# Patient Record
Sex: Female | Born: 1950 | Race: Black or African American | Hispanic: No | Marital: Single | Smoking: Never smoker
Health system: Southern US, Community
[De-identification: ages and names within clinical notes are randomized; demographics above are authoritative.]

## PROBLEM LIST (undated history)

## (undated) DIAGNOSIS — I10 Essential (primary) hypertension: Secondary | ICD-10-CM

## (undated) DIAGNOSIS — D689 Coagulation defect, unspecified: Secondary | ICD-10-CM

---

## 2018-07-05 ENCOUNTER — Other Ambulatory Visit: Payer: Self-pay

## 2018-07-05 ENCOUNTER — Inpatient Hospital Stay (HOSPITAL_BASED_OUTPATIENT_CLINIC_OR_DEPARTMENT_OTHER)
Admission: EM | Admit: 2018-07-05 | Discharge: 2018-07-08 | DRG: 176 | Disposition: A | Discharge status: Home / Self Care | Payer: Medicare HMO | Attending: Internal Medicine | Admitting: Internal Medicine

## 2018-07-05 ENCOUNTER — Encounter (HOSPITAL_BASED_OUTPATIENT_CLINIC_OR_DEPARTMENT_OTHER): Payer: Self-pay | Admitting: *Deleted

## 2018-07-05 ENCOUNTER — Emergency Department (HOSPITAL_BASED_OUTPATIENT_CLINIC_OR_DEPARTMENT_OTHER): Payer: Medicare HMO

## 2018-07-05 DIAGNOSIS — Z79899 Other long term (current) drug therapy: Secondary | ICD-10-CM | POA: Diagnosis not present

## 2018-07-05 DIAGNOSIS — R7989 Other specified abnormal findings of blood chemistry: Secondary | ICD-10-CM

## 2018-07-05 DIAGNOSIS — R0602 Shortness of breath: Secondary | ICD-10-CM | POA: Diagnosis not present

## 2018-07-05 DIAGNOSIS — E860 Dehydration: Secondary | ICD-10-CM | POA: Diagnosis present

## 2018-07-05 DIAGNOSIS — Z833 Family history of diabetes mellitus: Secondary | ICD-10-CM

## 2018-07-05 DIAGNOSIS — N179 Acute kidney failure, unspecified: Secondary | ICD-10-CM | POA: Diagnosis present

## 2018-07-05 DIAGNOSIS — Z6838 Body mass index (BMI) 38.0-38.9, adult: Secondary | ICD-10-CM | POA: Diagnosis not present

## 2018-07-05 DIAGNOSIS — E669 Obesity, unspecified: Secondary | ICD-10-CM | POA: Diagnosis present

## 2018-07-05 DIAGNOSIS — I248 Other forms of acute ischemic heart disease: Secondary | ICD-10-CM | POA: Diagnosis present

## 2018-07-05 DIAGNOSIS — Z8249 Family history of ischemic heart disease and other diseases of the circulatory system: Secondary | ICD-10-CM

## 2018-07-05 DIAGNOSIS — E785 Hyperlipidemia, unspecified: Secondary | ICD-10-CM | POA: Diagnosis present

## 2018-07-05 DIAGNOSIS — I2699 Other pulmonary embolism without acute cor pulmonale: Secondary | ICD-10-CM | POA: Diagnosis present

## 2018-07-05 DIAGNOSIS — I10 Essential (primary) hypertension: Secondary | ICD-10-CM | POA: Diagnosis present

## 2018-07-05 DIAGNOSIS — R778 Other specified abnormalities of plasma proteins: Secondary | ICD-10-CM | POA: Diagnosis present

## 2018-07-05 HISTORY — DX: Essential (primary) hypertension: I10

## 2018-07-05 LAB — TROPONIN I
Troponin I: 0.18 ng/mL (ref <0.03)
Troponin I: 0.26 ng/mL (ref <0.03)

## 2018-07-05 LAB — CBC
HCT: 42.5 % (ref 36.0–46.0)
Hemoglobin: 12.9 g/dL (ref 12.0–15.0)
MCH: 25.9 pg — ABNORMAL LOW (ref 26.0–34.0)
MCHC: 30.4 g/dL (ref 30.0–36.0)
MCV: 85.3 fL (ref 80.0–100.0)
PLATELETS: 235 10*3/uL (ref 150–400)
RBC: 4.98 MIL/uL (ref 3.87–5.11)
RDW: 14.3 % (ref 11.5–15.5)
WBC: 6.5 10*3/uL (ref 4.0–10.5)
nRBC: 0 % (ref 0.0–0.2)

## 2018-07-05 LAB — BASIC METABOLIC PANEL
Anion gap: 7 (ref 5–15)
BUN: 32 mg/dL — ABNORMAL HIGH (ref 8–23)
CO2: 25 mmol/L (ref 22–32)
Calcium: 9 mg/dL (ref 8.9–10.3)
Chloride: 104 mmol/L (ref 98–111)
Creatinine, Ser: 1.72 mg/dL — ABNORMAL HIGH (ref 0.44–1.00)
GFR calc Af Amer: 35 mL/min — ABNORMAL LOW (ref >60)
GFR, EST NON AFRICAN AMERICAN: 30 mL/min — AB (ref >60)
Glucose, Bld: 165 mg/dL — ABNORMAL HIGH (ref 70–99)
Potassium: 3.8 mmol/L (ref 3.5–5.1)
Sodium: 136 mmol/L (ref 135–145)

## 2018-07-05 MED ORDER — HEPARIN BOLUS VIA INFUSION
4500.0000 [IU] | Freq: Once | INTRAVENOUS | Status: AC
  Administered 2018-07-05: 4500 [IU] via INTRAVENOUS

## 2018-07-05 MED ORDER — HEPARIN (PORCINE) 25000 UT/250ML-% IV SOLN
1300.0000 [IU]/h | INTRAVENOUS | Status: DC
  Administered 2018-07-05: 1200 [IU]/h via INTRAVENOUS
  Administered 2018-07-06: 1050 [IU]/h via INTRAVENOUS
  Filled 2018-07-05 (×4): qty 250

## 2018-07-05 MED ORDER — SODIUM CHLORIDE 0.9 % IV BOLUS
1000.0000 mL | Freq: Once | INTRAVENOUS | Status: AC
  Administered 2018-07-05: 1000 mL via INTRAVENOUS

## 2018-07-05 MED ORDER — IOPAMIDOL (ISOVUE-370) INJECTION 76%
100.0000 mL | Freq: Once | INTRAVENOUS | Status: AC | PRN
  Administered 2018-07-05: 65 mL via INTRAVENOUS

## 2018-07-05 MED ORDER — SODIUM CHLORIDE 0.9 % IV SOLN
INTRAVENOUS | Status: DC | PRN
  Administered 2018-07-05: 500 mL via INTRAVENOUS

## 2018-07-05 MED ORDER — HYDROCODONE-ACETAMINOPHEN 5-325 MG PO TABS
2.0000 | ORAL_TABLET | Freq: Once | ORAL | Status: AC
  Administered 2018-07-05: 2 via ORAL
  Filled 2018-07-05: qty 2

## 2018-07-05 MED ORDER — ASPIRIN 81 MG PO CHEW
324.0000 mg | CHEWABLE_TABLET | Freq: Once | ORAL | Status: AC
  Administered 2018-07-05: 324 mg via ORAL
  Filled 2018-07-05: qty 4

## 2018-07-05 NOTE — Progress Notes (Signed)
  Referring physician: Dr. Clarene Duke at [facility].  88 F with HTN, came to ED with  heart palpitation, fatigue with mild tachycardia ( HR in low 100), afebrile. No chest pain and EKG with rate of 117 and mild ST depression on avF ,V6. Troponin elevated at 0.18. Creatinine of 1.72,( no baseline)  CT angio chest done in ED with low contrast showed bilateral pulmonary emboli with evidence of right heart strain.  No prior history of DVT or PE.  Patient maintaining O2 sat on room air and even ambulating without getting hypoxic or tachypneic.  Heart rate stable and occasionally in low 100s. Started on IV heparin drip. Accepted in stepdown unit at [facility]

## 2018-07-05 NOTE — Care Management (Addendum)
This is a no charge note  Transfer from [facility] per Dr. Clarene Duke.  Pt was initially accepted to [facility] by Dr. Theda Belfast, but there is no bed available there. Dr. Clarene Duke called back, I agreed to admit pt to [facility]U. Please see Dr.  Eugenio Hoes note for detail. Pt is hemodynamically stable currently. On IV heparin.  Please call manager of [facility] at [phone] when pt arrives to floor  Lorretta Harp, MD  [facility]   If 7PM-7AM, please contact night-coverage [email or web address] Password [email or web address] 07/05/2018, 9:22 PM

## 2018-07-05 NOTE — ED Triage Notes (Signed)
Palpitations since yesterday. She takes diuretics and feels her potassium is abnormal.

## 2018-07-05 NOTE — Progress Notes (Signed)
ANTICOAGULATION CONSULT NOTE - Initial Consult  Pharmacy Consult for heparin Indication: pulmonary embolus  Allergies  Allergen Reactions  . Sulfa Antibiotics Rash    Patient Measurements: Height: 5\' 3"  (160 cm) Weight: 195 lb (88.5 kg) IBW/kg (Calculated) : 52.4 Heparin Dosing Weight: 72.4kg  Vital Signs: Temp: 98.1 F (36.7 C) (03/20 1339) Temp Source: Oral (03/20 1339) BP: 111/76 (03/20 1339) Pulse Rate: 112 (03/20 1339)  Labs: Recent Labs    07/05/18 1358 07/05/18 1416  HGB  --  12.9  HCT  --  42.5  PLT  --  235  CREATININE  --  1.72*  TROPONINI 0.18*  --     Estimated Creatinine Clearance: 33.9 mL/min (A) (by C-G formula based on SCr of 1.72 mg/dL (H)).   Medical History: Past Medical History:  Diagnosis Date  . Hypertension     Assessment: 33 YOF with bilateral PE on CT, not on anticoagulation PTA, CBC wnl.  Goal of Therapy:  Heparin level 0.3-0.7 units/ml Monitor platelets by anticoagulation protocol: Yes   Plan:  Heparin 4500 unitx IV x1, and gtt at 1200 units/hr F/u 6 hour heparin level  Daylene Posey, PharmD Clinical Pharmacist Please check AMION for all [facility] numbers 07/05/2018 4:00 PM

## 2018-07-05 NOTE — ED Provider Notes (Signed)
[facility] Emergency Department Provider Note MRN:  [id number]  Arrival date & time: 07/05/18     Chief Complaint   Generalized weakness History of Present Illness   Denise Haynes is a 68 y.o. year-old female with a history of hypertension presenting to the ED with chief complaint of generalized weakness.  Patient feels like her potassium is low.  Explained that this is happened before and it causes her to feel generally weak, general malaise, dyspnea on exertion.  The symptoms have been present for the past 1 to 2 days.  Denies fever, no cold-like symptoms.  No chest pain, no shortness of breath while at rest, denies any bleeding, no abdominal pain, no dysuria.  Has been practicing a strict diet for the past several days, not eating very much at all, states that she is not drinking very much water either.  Review of Systems  A complete 10 system review of systems was obtained and all systems are negative except as noted in the HPI and PMH.   Patient's Health History    Past Medical History:  Diagnosis Date  . Hypertension     History reviewed. No pertinent surgical history.  No family history on file.  Social History   Socioeconomic History  . Marital status: Single    Spouse name: Not on file  . Number of children: Not on file  . Years of education: Not on file  . Highest education level: Not on file  Occupational History  . Not on file  Social Needs  . Financial resource strain: Not on file  . Food insecurity:    Worry: Not on file    Inability: Not on file  . Transportation needs:    Medical: Not on file    Non-medical: Not on file  Tobacco Use  . Smoking status: Never Smoker  . Smokeless tobacco: Never Used  Substance and Sexual Activity  . Alcohol use: Not Currently  . Drug use: Never  . Sexual activity: Not on file  Lifestyle  . Physical activity:    Days per week: Not on file    Minutes per session: Not on file  . Stress: Not  on file  Relationships  . Social connections:    Talks on phone: Not on file    Gets together: Not on file    Attends religious service: Not on file    Active member of club or organization: Not on file    Attends meetings of clubs or organizations: Not on file    Relationship status: Not on file  . Intimate partner violence:    Fear of current or ex partner: Not on file    Emotionally abused: Not on file    Physically abused: Not on file    Forced sexual activity: Not on file  Other Topics Concern  . Not on file  Social History Narrative  . Not on file     Physical Exam  Vital Signs and Nursing Notes reviewed Vitals:   07/05/18 1339  BP: 111/76  Pulse: (!) 112  Resp: 18  Temp: 98.1 F (36.7 C)  SpO2: 98%    CONSTITUTIONAL: Well-appearing, NAD NEURO:  Alert and oriented x 3, no focal deficits EYES:  eyes equal and reactive ENT/NECK:  no LAD, no JVD CARDIO: Tachycardic rate, well-perfused, normal S1 and S2 PULM:  CTAB no wheezing or rhonchi GI/GU:  normal bowel sounds, non-distended, non-tender MSK/SPINE:  No gross deformities, no edema SKIN:  no  rash, atraumatic PSYCH:  Appropriate speech and behavior  Diagnostic and Interventional Summary    EKG Interpretation  Date/Time:  Friday July 05 2018 13:36:22 EDT Ventricular Rate:  117 PR Interval:  176 QRS Duration: 100 QT Interval:  322 QTC Calculation: 449 R Axis:   61 Text Interpretation:  Sinus tachycardia Septal infarct , age undetermined Abnormal ECG Confirmed by Kennis Carina [id number]) on 07/05/2018 2:15:23 PM      Labs Reviewed  CBC - Abnormal; Notable for the following components:      Result Value   MCH 25.9 (*)    All other components within normal limits  BASIC METABOLIC PANEL - Abnormal; Notable for the following components:   Glucose, Bld 165 (*)    BUN 32 (*)    Creatinine, Ser 1.72 (*)    GFR calc non Af Amer 30 (*)    GFR calc Af Amer 35 (*)    All other components within normal limits   TROPONIN I - Abnormal; Notable for the following components:   Troponin I 0.18 (*)    All other components within normal limits    CT ANGIO CHEST PE W OR WO CONTRAST  Final Result    DG Chest 2 View  Final Result      Medications  heparin bolus via infusion 4,500 Units (has no administration in time range)  heparin ADULT infusion 100 units/mL (25000 units/246mL sodium chloride 0.45%) (has no administration in time range)  sodium chloride 0.9 % bolus 1,000 mL (1,000 mLs Intravenous New Bag/Given 07/05/18 1513)  iopamidol (ISOVUE-370) 76 % injection 100 mL (65 mLs Intravenous Contrast Given 07/05/18 1526)  aspirin chewable tablet 324 mg (324 mg Oral Given 07/05/18 1515)     Procedures Critical Care Critical Care Documentation Critical care time provided by me (excluding procedures): 36 minutes  Condition necessitating critical care: Bilateral acute pulmonary embolism with right heart strain  Components of critical care management: reviewing of prior records, laboratory and imaging interpretation, frequent re-examination and reassessment of vital signs, administration of IV fluids, IV heparin, discussion with consulting services.    ED Course and Medical Decision Making  I have reviewed the triage vital signs and the nursing notes.  Pertinent labs & imaging results that were available during my care of the patient were reviewed by me and considered in my medical decision making (see below for details).  Suspect electrolyte abnormality or dehydration as etiology of patient's symptoms.  Also considering anemia, little to no concern for PE given lack of chest pain, no shortness of breath while at rest, no evidence of DVT.  Labs pending.  Labs reveal normal electrolytes, troponin elevated at 0.18.  Given this information and her continued unexplained tachycardia, the concern for pulmonary embolism has increased.  Patient's GFR is 35, discussed with radiology, will do low-dose contrast but  will still be able to perform CTA of the chest.  CTA confirms bilateral pulmonary emboli with mild right heart strain.  Patient is still very well-appearing with normal vital signs, ambulating to the restroom without assistance, no increased work of breathing.  Seems appropriate for stepdown unit, will call for admission.  Elmer Sow. Pilar Plate, MD [facility] Emergency Medicine [facility] [email or web address]  Final Clinical Impressions(s) / ED Diagnoses     ICD-10-CM   1. Acute pulmonary embolism, unspecified pulmonary embolism type, unspecified whether acute cor pulmonale present (HCC) I26.99   2. SOB (shortness of breath) R06.02 DG Chest 2 View    DG  Chest 2 View    ED Discharge Orders    None         Sabas Sous, MD 07/05/18 [phone]

## 2018-07-05 NOTE — ED Notes (Signed)
Called bed placement to check status of bed at [facility] and was advised that they did not have any beds due to "holding" and were not sure if they would be getting any.  Spoke with bed placement at [facility] (per Dr. Fredirick Maudlin request) and was told that they had beds but we would need to request a consult with hospitalst.  Called [facility] back and requested consult to hospitalst at [facility].

## 2018-07-05 NOTE — ED Notes (Signed)
Informed patient that I will update her with the room assignment and ambulance time of arrival. Patient verbalized understanding.

## 2018-07-05 NOTE — ED Provider Notes (Addendum)
I received this patient in signout from Dr. Pilar Plate. She had CTA confirming b/l PE with right heart strain and mildly positive troponin, although vital signs have been reassuring here.  She has no oxygen requirement, blood pressure is normal, and she is ambulatory in the ED.  Discussed admission with [facility]. Ni, and pt admitted to [facility] for further care. Well appearing on reassessment.   Denise Haynes, Ambrose Finland, MD 07/05/18 1642    Thayer Inabinet, Ambrose Finland, MD 07/05/18 2200

## 2018-07-06 ENCOUNTER — Other Ambulatory Visit (HOSPITAL_COMMUNITY): Payer: Medicare HMO

## 2018-07-06 ENCOUNTER — Encounter (HOSPITAL_COMMUNITY): Payer: Self-pay | Admitting: Internal Medicine

## 2018-07-06 DIAGNOSIS — R778 Other specified abnormalities of plasma proteins: Secondary | ICD-10-CM | POA: Diagnosis present

## 2018-07-06 DIAGNOSIS — I10 Essential (primary) hypertension: Secondary | ICD-10-CM

## 2018-07-06 DIAGNOSIS — E785 Hyperlipidemia, unspecified: Secondary | ICD-10-CM | POA: Diagnosis present

## 2018-07-06 DIAGNOSIS — N179 Acute kidney failure, unspecified: Secondary | ICD-10-CM | POA: Diagnosis present

## 2018-07-06 DIAGNOSIS — R7989 Other specified abnormal findings of blood chemistry: Secondary | ICD-10-CM | POA: Diagnosis present

## 2018-07-06 LAB — HEPARIN LEVEL (UNFRACTIONATED)
Heparin Unfractionated: 0.85 IU/mL — ABNORMAL HIGH (ref 0.30–0.70)
Heparin Unfractionated: 0.7 IU/mL (ref 0.30–0.70)
Heparin Unfractionated: 0.5 IU/mL (ref 0.30–0.70)

## 2018-07-06 LAB — CBC
HCT: 38.7 % (ref 36.0–46.0)
Hemoglobin: 11.8 g/dL — ABNORMAL LOW (ref 12.0–15.0)
MCH: 25.7 pg — ABNORMAL LOW (ref 26.0–34.0)
MCHC: 30.5 g/dL (ref 30.0–36.0)
MCV: 84.1 fL (ref 80.0–100.0)
Platelets: 223 10*3/uL (ref 150–400)
RBC: 4.6 MIL/uL (ref 3.87–5.11)
RDW: 14.3 % (ref 11.5–15.5)
WBC: 7.2 10*3/uL (ref 4.0–10.5)
nRBC: 0 % (ref 0.0–0.2)

## 2018-07-06 LAB — BASIC METABOLIC PANEL
Anion gap: 8 (ref 5–15)
BUN: 24 mg/dL — ABNORMAL HIGH (ref 8–23)
CO2: 26 mmol/L (ref 22–32)
Calcium: 8.9 mg/dL (ref 8.9–10.3)
Chloride: 106 mmol/L (ref 98–111)
Creatinine, Ser: 1.48 mg/dL — ABNORMAL HIGH (ref 0.44–1.00)
GFR calc Af Amer: 42 mL/min — ABNORMAL LOW (ref >60)
GFR calc non Af Amer: 37 mL/min — ABNORMAL LOW (ref >60)
Glucose, Bld: 101 mg/dL — ABNORMAL HIGH (ref 70–99)
Potassium: 4.3 mmol/L (ref 3.5–5.1)
Sodium: 140 mmol/L (ref 135–145)

## 2018-07-06 LAB — RAPID URINE DRUG SCREEN, HOSP PERFORMED
Amphetamines: NOT DETECTED
Barbiturates: NOT DETECTED
Benzodiazepines: NOT DETECTED
Cocaine: NOT DETECTED
Opiates: POSITIVE — AB
Tetrahydrocannabinol: NOT DETECTED

## 2018-07-06 LAB — TROPONIN I
Troponin I: 0.15 ng/mL (ref <0.03)
Troponin I: 0.11 ng/mL (ref <0.03)
Troponin I: 0.08 ng/mL (ref <0.03)

## 2018-07-06 LAB — LIPID PANEL
CHOLESTEROL: 183 mg/dL (ref 0–200)
HDL: 47 mg/dL (ref >40)
LDL Cholesterol: 110 mg/dL — ABNORMAL HIGH (ref 0–99)
Total CHOL/HDL Ratio: 3.9 RATIO
Triglycerides: 130 mg/dL (ref <150)
VLDL: 26 mg/dL (ref 0–40)

## 2018-07-06 LAB — HEMOGLOBIN A1C
Hgb A1c MFr Bld: 6.7 % — ABNORMAL HIGH (ref 4.8–5.6)
Mean Plasma Glucose: 145.59 mg/dL

## 2018-07-06 LAB — BRAIN NATRIURETIC PEPTIDE: B Natriuretic Peptide: 82.5 pg/mL (ref 0.0–100.0)

## 2018-07-06 LAB — HIV ANTIBODY (ROUTINE TESTING W REFLEX): HIV Screen 4th Generation wRfx: NONREACTIVE

## 2018-07-06 MED ORDER — PRAVASTATIN SODIUM 10 MG PO TABS: 20.0000 mg | ORAL_TABLET | Freq: Every day | ORAL | Status: DC

## 2018-07-06 MED ORDER — HYDRALAZINE HCL 20 MG/ML IJ SOLN: 5.0000 mg | INTRAMUSCULAR | Status: DC | PRN

## 2018-07-06 MED ORDER — AMLODIPINE BESYLATE 5 MG PO TABS
5.0000 mg | ORAL_TABLET | Freq: Every day | ORAL | Status: DC
  Administered 2018-07-06: 5 mg via ORAL
  Filled 2018-07-06: qty 1

## 2018-07-06 MED ORDER — AMLODIPINE BESYLATE 5 MG PO TABS: 5.0000 mg | ORAL_TABLET | Freq: Every day | ORAL | Status: DC

## 2018-07-06 MED ORDER — AMLODIPINE BESYLATE 2.5 MG PO TABS
2.5000 mg | ORAL_TABLET | Freq: Every day | ORAL | Status: DC
  Administered 2018-07-07 – 2018-07-08 (×2): 2.5 mg via ORAL
  Filled 2018-07-06 (×2): qty 1

## 2018-07-06 MED ORDER — ONDANSETRON HCL 4 MG PO TABS: 4.0000 mg | ORAL_TABLET | Freq: Four times a day (QID) | ORAL | Status: DC | PRN

## 2018-07-06 MED ORDER — PRAVASTATIN SODIUM 10 MG PO TABS
20.0000 mg | ORAL_TABLET | Freq: Every day | ORAL | Status: DC
  Administered 2018-07-06 – 2018-07-08 (×3): 20 mg via ORAL
  Filled 2018-07-06 (×3): qty 2

## 2018-07-06 MED ORDER — ASPIRIN EC 81 MG PO TBEC
81.0000 mg | DELAYED_RELEASE_TABLET | Freq: Every day | ORAL | Status: DC
  Administered 2018-07-06 – 2018-07-08 (×3): 81 mg via ORAL
  Filled 2018-07-06 (×3): qty 1

## 2018-07-06 MED ORDER — SODIUM CHLORIDE 0.9 % IV SOLN: INTRAVENOUS | Status: DC | PRN

## 2018-07-06 MED ORDER — ZOLPIDEM TARTRATE 5 MG PO TABS
5.0000 mg | ORAL_TABLET | Freq: Every evening | ORAL | Status: DC | PRN
  Administered 2018-07-06 – 2018-07-07 (×3): 5 mg via ORAL
  Filled 2018-07-06 (×3): qty 1

## 2018-07-06 MED ORDER — ACETAMINOPHEN 650 MG RE SUPP: 650.0000 mg | Freq: Four times a day (QID) | RECTAL | Status: DC | PRN

## 2018-07-06 MED ORDER — ONDANSETRON HCL 4 MG/2ML IJ SOLN: 4.0000 mg | Freq: Four times a day (QID) | INTRAMUSCULAR | Status: DC | PRN

## 2018-07-06 MED ORDER — ACETAMINOPHEN 325 MG PO TABS: 650.0000 mg | ORAL_TABLET | Freq: Four times a day (QID) | ORAL | Status: DC | PRN

## 2018-07-06 MED ORDER — DM-GUAIFENESIN ER 30-600 MG PO TB12: 1.0000 | ORAL_TABLET | Freq: Two times a day (BID) | ORAL | Status: DC | PRN

## 2018-07-06 MED ORDER — ALBUTEROL SULFATE (2.5 MG/3ML) 0.083% IN NEBU: 2.5000 mg | INHALATION_SOLUTION | RESPIRATORY_TRACT | Status: DC | PRN

## 2018-07-06 NOTE — Progress Notes (Signed)
PROGRESS NOTE    Shivaun Gorden  ONG:[id number] DOB: 08/29/1950 DOA: 07/05/2018 PCP: System, Pcp Not In  Outpatient Specialists:   Brief Narrative: As per H&P done by Dr. Lorretta Harp "Denise Haynes is a 68 y.o. female with medical history significant of hypertension, hyperlipidemia, chronic leg edema, who presents with shortness of breath.  Patient states that she has been having shortness of breath, particularly on exertion in the past 2 days, which has been progressively worsening.  She also has generalized weakness, malaise, and palpitation. No fever or chills.  She has dry cough, but no chest pain.  Denies recent long distant traveling.  No tenderness in the calf areas.  Patient does not have nausea, vomiting, diarrhea, abdominal pain, symptoms of UTI or unilateral weakness.  No family history of blood clot.  ED Course: pt was found to have WBC 6.5, troponin 0.18-->0.26, AKI with creatinine 1.72, BUN 32, temperature normal, tachycardia, tachypnea, oxygen saturation 95-100% on room air.  Chest x-ray negative.  CT angiogram of chest showed bilateral PE with evidence of right heart strain.  Patient is admitted to stepdown bed as inpatient".  07/06/2018: Patient seen.  Also discussed with patient's daughter over the phone.  Explained patient's condition to patient and the patient's daughter, and answered all their questions.  Shortness of breath is improving.  We will continue IV heparin for the next 24 to 48 hours.  Likely discharge back home on Eliquis or Xarelto on 07/08/2018.  Continue to monitor closely.  There seems not to be any provocative factor for the severe thromboembolic disease.   Assessment & Plan:   Principal Problem:   Bilateral pulmonary embolism (HCC) Active Problems:   HTN (hypertension)   HLD (hyperlipidemia)   AKI (acute kidney injury) (HCC)   Elevated troponin   Bilateral pulmonary embolism with right heart strain:  -CT angiogram showed bilateral PE with CT evidence  of right heart strain.   -Patient remains hemodynamically stable.   No shortness of breath.   Continue IV heparin.   Likely transition to Eliquis or Xarelto and denies 48 hours or so.   Updated patient's daughter.  Elevated troponin:  Troponin elevation is likely secondary to the severity of pulmonary embolism.  Primary prognostic significance.    HTN (hypertension): -PRN hydralazine -Hold home torsemide and Maxide due to AKI -Will decrease amlodipine to 2.5 Mg p.o. once daily. -Continue to monitor blood pressure closely.  HLD (hyperlipidemia): -Pravastatin  AKI Versus AKI on CKD versus CKD: Serum creatinine today is 1.48. Continue to monitor closely.  Continue to avoid nephrotoxic medications.  DVT prophylaxis: IV heparin Code Status: Full code Family Communication: Patient's daughter Disposition Plan: Home eventually   Consultants:   None  Procedures:   None  Antimicrobials:   None   Subjective: Shortness of breath is improving. No chest pain.  Objective: Vitals:   07/05/18 2230 07/05/18 2328 07/06/18 0044 07/06/18 0416  BP: 128/76 120/76 (!) 150/85 (!) 84/54  Pulse: 88 88 88 93  Resp: 17 20 18 18   Temp:   97.9 F (36.6 C) 97.8 F (36.6 C)  TempSrc:   Oral Oral  SpO2: 98% 99% 100% 97%  Weight:    99.8 kg  Height:        Intake/Output Summary (Last 24 hours) at 07/06/2018 1224 Last data filed at 07/06/2018 0353 Gross per 24 hour  Intake 1286.28 ml  Output --  Net 1286.28 ml   Filed Weights   07/05/18 1335 07/06/18 0416  Weight: 88.5  kg 99.8 kg    Examination:  General exam: Appears calm and comfortable.  Moderately obese. Respiratory system: Clear to auscultation. Cardiovascular system: S1 & S2 heard. Gastrointestinal system: Obese, soft and nontender.  Organs are difficult to assess.   Central nervous system: Alert and oriented.  Patient moves all limbs. Extremities: No leg edema.  Data Reviewed: I have personally reviewed  following labs and imaging studies  CBC: Recent Labs  Lab 07/05/18 1416 07/06/18 0846  WBC 6.5 7.2  HGB 12.9 11.8*  HCT 42.5 38.7  MCV 85.3 84.1  PLT 235 223   Basic Metabolic Panel: Recent Labs  Lab 07/05/18 1416 07/06/18 0846  NA 136 140  K 3.8 4.3  CL 104 106  CO2 25 26  GLUCOSE 165* 101*  BUN 32* 24*  CREATININE 1.72* 1.48*  CALCIUM 9.0 8.9   GFR: Estimated Creatinine Clearance: 42.1 mL/min (A) (by C-G formula based on SCr of 1.48 mg/dL (H)). Liver Function Tests: No results for input(s): AST, ALT, ALKPHOS, BILITOT, PROT, ALBUMIN in the last 168 hours. No results for input(s): LIPASE, AMYLASE in the last 168 hours. No results for input(s): AMMONIA in the last 168 hours. Coagulation Profile: No results for input(s): INR, PROTIME in the last 168 hours. Cardiac Enzymes: Recent Labs  Lab 07/05/18 1358 07/05/18 2217 07/06/18 0232 07/06/18 0846  TROPONINI 0.18* 0.26* 0.15* 0.11*   BNP (last 3 results) No results for input(s): PROBNP in the last 8760 hours. HbA1C: Recent Labs    07/06/18 0232  HGBA1C 6.7*   CBG: No results for input(s): GLUCAP in the last 168 hours. Lipid Profile: Recent Labs    07/06/18 0232  CHOL 183  HDL 47  LDLCALC 110*  TRIG 130  CHOLHDL 3.9   Thyroid Function Tests: No results for input(s): TSH, T4TOTAL, FREET4, T3FREE, THYROIDAB in the last 72 hours. Anemia Panel: No results for input(s): VITAMINB12, FOLATE, FERRITIN, TIBC, IRON, RETICCTPCT in the last 72 hours. Urine analysis: No results found for: COLORURINE, APPEARANCEUR, LABSPEC, PHURINE, GLUCOSEU, HGBUR, BILIRUBINUR, KETONESUR, PROTEINUR, UROBILINOGEN, NITRITE, LEUKOCYTESUR Sepsis Labs: (procalcitonin:4,lacticidven:4)  )No results found for this or any previous visit (from the past 240 hour(s)).       Radiology Studies: Dg Chest 2 View  Result Date: 07/05/2018 CLINICAL DATA:  Chest pain, shortness of breath, smoker EXAM: CHEST - 2 VIEW COMPARISON:   None. FINDINGS: The heart size and mediastinal contours are within normal limits. Both lungs are clear. The visualized skeletal structures are unremarkable. IMPRESSION: No acute abnormality of the lungs. Electronically Signed   By: Lauralyn Primes M.D.   On: 07/05/2018 14:34   Ct Angio Chest Pe W Or Wo Contrast  Result Date: 07/05/2018 CLINICAL DATA:  Shortness of breath for 1 week EXAM: CT ANGIOGRAPHY CHEST WITH CONTRAST TECHNIQUE: Multidetector CT imaging of the chest was performed using the standard protocol during bolus administration of intravenous contrast. Multiplanar CT image reconstructions and MIPs were obtained to evaluate the vascular anatomy. CONTRAST:  65mL ISOVUE-370 COMPARISON:  None. FINDINGS: Cardiovascular: Thoracic aorta demonstrates mild atherosclerotic calcifications. No aneurysmal dilatation is seen. No significant cardiac enlargement is noted. Pulmonary artery demonstrates a normal branching pattern with diffuse intraluminal filling defects consistent with pulmonary emboli right greater than left. There are changes consistent with right heart strain with a calculated RV/LV ratio of 1.18. Mediastinum/Nodes: Thoracic inlet is within normal limits. No hilar or mediastinal adenopathy is noted. The esophagus as visualized is within normal limits. Lungs/Pleura: Well aerated without focal infiltrate or sizable  effusion. No pneumothorax is noted. Upper Abdomen: Visualized upper abdomen is within normal limits. Musculoskeletal: Mild degenerative changes of the thoracic spine are noted. No acute bony abnormality is seen. Review of the MIP images confirms the above findings. IMPRESSION: Bilateral pulmonary emboli with evidence of right heart strain. Critical Value/emergent results were called by telephone at the time of interpretation on 07/05/2018 at 3:53 pm to Dr. Kennis Carina , who verbally acknowledged these results. Electronically Signed   By: Alcide Clever M.D.   On: 07/05/2018 15:53         Scheduled Meds:  amLODipine  5 mg Oral Daily   aspirin EC  81 mg Oral Daily   pravastatin  20 mg Oral q1800   Continuous Infusions:  sodium chloride Stopped (07/06/18 0158)   heparin 1,050 Units/hr (07/06/18 1028)     LOS: 1 day    Time spent: 35 minutes.    Berton Mount, MD  [facility] Pager #: [phone] 7PM-7AM contact night coverage as above

## 2018-07-06 NOTE — H&P (Signed)
History and Physical    Denise Haynes KLK:[id number] DOB: 08-03-1950 DOA: 07/05/2018  Referring MD/NP/PA:   PCP: System, Pcp Not In   Patient coming from:  The patient is coming from home.  At baseline, pt is independent for most of ADL.        Chief Complaint: SOB  HPI: Denise Haynes is a 68 y.o. female with medical history significant of hypertension, hyperlipidemia, chronic leg edema, who presents with shortness of breath.  Patient states that she has been having shortness of breath, particularly on exertion in the past 2 days, which has been progressively worsening.  She also has generalized weakness, malaise, and palpitation. No fever or chills.  She has dry cough, but no chest pain.  Denies recent long distant traveling.  No tenderness in the calf areas.  Patient does not have nausea, vomiting, diarrhea, abdominal pain, symptoms of UTI or unilateral weakness.  No family history of blood clot.  ED Course: pt was found to have WBC 6.5, troponin 0.18-->0.26, AKI with creatinine 1.72, BUN 32, temperature normal, tachycardia, tachypnea, oxygen saturation 95-100% on room air.  Chest x-ray negative.  CT angiogram of chest showed bilateral PE with evidence of right heart strain.  Patient is admitted to stepdown bed as inpatient.  Review of Systems:   General: no fevers, chills, no body weight gain, has fatigue HEENT: no blurry vision, hearing changes or sore throat Respiratory: has dyspnea, coughing, no wheezing CV: no chest pain, no palpitations GI: no nausea, vomiting, abdominal pain, diarrhea, constipation GU: no dysuria, burning on urination, increased urinary frequency, hematuria  Ext: has trace leg edema Neuro: no unilateral weakness, numbness, or tingling, no vision change or hearing loss Skin: no rash, no skin tear. MSK: No muscle spasm, no deformity, no limitation of range of movement in spin Heme: No easy bruising.  Travel history: No recent long distant travel.  Allergy:    Allergies  Allergen Reactions   Sulfa Antibiotics Rash    Past Medical History:  Diagnosis Date   Hypertension     Past Surgical History:  Procedure Laterality Date   CESAREAN SECTION      Social History:  reports that she has never smoked. She has never used smokeless tobacco. She reports previous alcohol use. She reports that she does not use drugs.  Family History:  Family History  Problem Relation Age of Onset   Diabetes Mellitus II Mother    Hypertension Mother    Heart disease Father    Diabetes Mellitus II Sister      Prior to Admission medications   Medication Sig Start Date End Date Taking? Authorizing Provider  Potassium Chloride ER 20 MEQ TBCR Take by mouth.   Yes [provider]  pravastatin (PRAVACHOL) 20 MG tablet Take by mouth. 08/29/17  Yes [provider]  torsemide (DEMADEX) 20 MG tablet Take by mouth. 09/20/17  Yes [provider]  traMADol (ULTRAM) 50 MG tablet Take by mouth. 09/24/17  Yes [provider]  triamterene-hydrochlorothiazide (MAXZIDE) 75-50 MG tablet Take by mouth. 08/23/17  Yes [provider]    Physical Exam: Vitals:   07/05/18 2202 07/05/18 2230 07/05/18 2328 07/06/18 0044  BP: (!) 135/99 128/76 120/76 (!) 150/85  Pulse: 85 88 88 88  Resp: (!) 21 17 20 18   Temp:    97.9 F (36.6 C)  TempSrc:    Oral  SpO2: 98% 98% 99% 100%  Weight:      Height:  General: Not in acute distress HEENT:       Eyes: PERRL, EOMI, no scleral icterus.       ENT: No discharge from the ears and nose, no pharynx injection, no tonsillar enlargement.        Neck: No JVD, no bruit, no mass felt. Heme: No neck lymph node enlargement. Cardiac: S1/S2, RRR, No murmurs, No gallops or rubs. Respiratory: No rales, wheezing, rhonchi or rubs. GI: Soft, nondistended, nontender, no rebound pain, no organomegaly, BS present. GU: No hematuria Ext: has trace leg edema bilaterally. 2+DP/PT pulse  bilaterally. Musculoskeletal: No joint deformities, No joint redness or warmth, no limitation of ROM in spin. Skin: No rashes.  Neuro: Alert, oriented X3, cranial nerves II-XII grossly intact, moves all extremities normally.  Psych: Patient is not psychotic, no suicidal or hemocidal ideation.  Labs on Admission: I have personally reviewed following labs and imaging studies  CBC: Recent Labs  Lab 07/05/18 1416  WBC 6.5  HGB 12.9  HCT 42.5  MCV 85.3  PLT 235   Basic Metabolic Panel: Recent Labs  Lab 07/05/18 1416  NA 136  K 3.8  CL 104  CO2 25  GLUCOSE 165*  BUN 32*  CREATININE 1.72*  CALCIUM 9.0   GFR: Estimated Creatinine Clearance: 33.9 mL/min (A) (by C-G formula based on SCr of 1.72 mg/dL (H)). Liver Function Tests: No results for input(s): AST, ALT, ALKPHOS, BILITOT, PROT, ALBUMIN in the last 168 hours. No results for input(s): LIPASE, AMYLASE in the last 168 hours. No results for input(s): AMMONIA in the last 168 hours. Coagulation Profile: No results for input(s): INR, PROTIME in the last 168 hours. Cardiac Enzymes: Recent Labs  Lab 07/05/18 1358 07/05/18 2217  TROPONINI 0.18* 0.26*   BNP (last 3 results) No results for input(s): PROBNP in the last 8760 hours. HbA1C: No results for input(s): HGBA1C in the last 72 hours. CBG: No results for input(s): GLUCAP in the last 168 hours. Lipid Profile: No results for input(s): CHOL, HDL, LDLCALC, TRIG, CHOLHDL, LDLDIRECT in the last 72 hours. Thyroid Function Tests: No results for input(s): TSH, T4TOTAL, FREET4, T3FREE, THYROIDAB in the last 72 hours. Anemia Panel: No results for input(s): VITAMINB12, FOLATE, FERRITIN, TIBC, IRON, RETICCTPCT in the last 72 hours. Urine analysis: No results found for: COLORURINE, APPEARANCEUR, LABSPEC, PHURINE, GLUCOSEU, HGBUR, BILIRUBINUR, KETONESUR, PROTEINUR, UROBILINOGEN, NITRITE, LEUKOCYTESUR Sepsis Labs: (procalcitonin:4,lacticidven:4) )No results found for  this or any previous visit (from the past 240 hour(s)).   Radiological Exams on Admission: Dg Chest 2 View  Result Date: 07/05/2018 CLINICAL DATA:  Chest pain, shortness of breath, smoker EXAM: CHEST - 2 VIEW COMPARISON:  None. FINDINGS: The heart size and mediastinal contours are within normal limits. Both lungs are clear. The visualized skeletal structures are unremarkable. IMPRESSION: No acute abnormality of the lungs. Electronically Signed   By: Lauralyn Primes M.D.   On: 07/05/2018 14:34   Ct Angio Chest Pe W Or Wo Contrast  Result Date: 07/05/2018 CLINICAL DATA:  Shortness of breath for 1 week EXAM: CT ANGIOGRAPHY CHEST WITH CONTRAST TECHNIQUE: Multidetector CT imaging of the chest was performed using the standard protocol during bolus administration of intravenous contrast. Multiplanar CT image reconstructions and MIPs were obtained to evaluate the vascular anatomy. CONTRAST:  65mL [id number] COMPARISON:  None. FINDINGS: Cardiovascular: Thoracic aorta demonstrates mild atherosclerotic calcifications. No aneurysmal dilatation is seen. No significant cardiac enlargement is noted. Pulmonary artery demonstrates a normal branching pattern with diffuse intraluminal filling defects consistent with pulmonary emboli right  greater than left. There are changes consistent with right heart strain with a calculated RV/LV ratio of 1.18. Mediastinum/Nodes: Thoracic inlet is within normal limits. No hilar or mediastinal adenopathy is noted. The esophagus as visualized is within normal limits. Lungs/Pleura: Well aerated without focal infiltrate or sizable effusion. No pneumothorax is noted. Upper Abdomen: Visualized upper abdomen is within normal limits. Musculoskeletal: Mild degenerative changes of the thoracic spine are noted. No acute bony abnormality is seen. Review of the MIP images confirms the above findings. IMPRESSION: Bilateral pulmonary emboli with evidence of right heart strain. Critical Value/emergent  results were called by telephone at the time of interpretation on 07/05/2018 at 3:53 pm to Dr. Kennis CarinaMICHAEL BERO , who verbally acknowledged these results. Electronically Signed   By: Alcide CleverMark  Lukens M.D.   On: 07/05/2018 15:53     EKG: Independently reviewed.  Sinus tachycardia, QTC 449, poor R wave progression, LAE, anteroseptal infarction pattern, nonspecific T wave changes  Assessment/Plan Principal Problem:   Bilateral pulmonary embolism (HCC) Active Problems:   HTN (hypertension)   HLD (hyperlipidemia)   AKI (acute kidney injury) (HCC)   Elevated troponin   Bilateral pulmonary embolism [facility](HCC): CT angiogram showed bilateral PE with CT evidence of right heart strain.  Patient is hemodynamically stable.  No oxygen desaturation.  No triggering factors identified.  -admit to stepdown as inpt (pt was admitted to SDU by accepting MD) -heparin drip initiated -2D echocardiogram ordered -LE dopplers ordered to evaluate for DVT -BNP -prn albuterol nebs and mucinex  -allow pt eat once now (1:30 AM), then NPO in case pt needs any procedure  Elevated troponin: 0.18-->0.26. no CP.  Also likely demand ischemia secondary to bilateral PE. - cycle CE q6 x3 and repeat EKG in the am  - Aspirin, pravastatin - Risk factor stratification: will check FLP and A1C, UDS  - 2d echo  HTN (hypertension): -PRN hydralazine -Hold home torsemide and Maxide due to AKI -Start amlodipine 5 mg daily  HLD (hyperlipidemia): -Pravastatin  AKI: Likely due to prerenal secondary to dehydration and continuation of ACEI, ARB, diuretics, NSAIDs - IVF: 1L NS, then 75 cc/h - Follow up renal function by BMP - Hold torsemide and Maxide     Inpatient status:  # Patient requires inpatient status due to high intensity of service, high risk for further deterioration and high frequency of surveillance required.  I certify that at the point of admission it is my clinical judgment that the patient will require inpatient hospital  care spanning beyond 2 midnights from the point of admission.   This patient has multiple chronic comorbidities including hypertension, hyperlipidemia, chronic leg edema  Now patient has presenting with bilateral PE with CT evidence of right heart strain, elevated troponin  The worrisome physical exam findings include trace leg edema bilaterally  The initial radiographic and laboratory data are worrisome because of elevated troponin, bilateral PE with CT evidence of right heart strain by CT angiogram  Current medical needs: please see my assessment and plan  Predictability of an adverse outcome (risk): Patient presents with bilateral PE with CT evidence of right heart straining, also has elevated troponin, indicating patient has large clot burden.  Patient is at high risk for deteriorating.  Will need to be treated and observed in hospital for at least 2 days.     DVT ppx: on IV Heparin  Code Status: Full code Family Communication: None at bed side.  Disposition Plan:  Anticipate discharge back to previous home environment Consults called:  none Admission status:  SDU/inpation       Date of Service 07/06/2018    Lorretta Harp [facility]   If 7PM-7AM, please contact night-coverage [email or web address] Password [email or web address] 07/06/2018, 1:58 AM

## 2018-07-06 NOTE — Progress Notes (Signed)
[facility] admissions pager paged via Amion to notify of patient's arrival to unit.

## 2018-07-06 NOTE — Progress Notes (Signed)
NTICOAGULATION CONSULT NOTE - Follow Up Consult  Pharmacy Consult for heparin Indication: pulmonary embolus  Labs: Recent Labs    07/05/18 1416 07/05/18 2213  07/06/18 0232 07/06/18 0846 07/06/18 1405 07/06/18 1821  HGB 12.9  --   --   --  11.8*  --   --   HCT 42.5  --   --   --  38.7  --   --   PLT 235  --   --   --  223  --   --   HEPARINUNFRC  --  0.85*  --   --  0.70  --  0.50  CREATININE 1.72*  --   --   --  1.48*  --   --   TROPONINI  --   --    < > 0.15* 0.11* 0.08*  --    < > = values in this interval not displayed.    Assessment: 68yo female on heparin for PE. Heparin level is now therapeutic. No bleeding noted.   Goal of Therapy:  Heparin level 0.3-0.7 units/ml   Plan:  Continue heparin at 1050 units/hr  F/u AM heparin level  Lysle Pearl, PharmD, BCPS Please see AMION for all pharmacy numbers 07/06/2018 7:22 PM

## 2018-07-06 NOTE — Progress Notes (Signed)
ANTICOAGULATION CONSULT NOTE - Follow Up Consult  Pharmacy Consult for heparin Indication: pulmonary embolus  Labs: Recent Labs    07/05/18 1358 07/05/18 1416 07/05/18 2213 07/05/18 2217  HGB  --  12.9  --   --   HCT  --  42.5  --   --   PLT  --  235  --   --   HEPARINUNFRC  --   --  0.85*  --   CREATININE  --  1.72*  --   --   TROPONINI 0.18*  --   --  0.26*    Assessment: 67yo female supratherapeutic on heparin with initial dosing for PE; no gtt issues or signs of bleeding per RN.  Goal of Therapy:  Heparin level 0.3-0.7 units/ml   Plan:  Will decrease heparin gtt by ~1 units/kg/hr to 1100 units/hr and check level in 8 hours.    Denise Haynes, PharmD, BCPS  07/06/2018,12:45 AM

## 2018-07-06 NOTE — Progress Notes (Signed)
ANTICOAGULATION CONSULT NOTE - Follow Up Consult  Pharmacy Consult for heparin Indication: pulmonary embolus  Labs: Recent Labs    07/05/18 1358 07/05/18 1416 07/05/18 2213 07/05/18 2217 07/06/18 0232 07/06/18 0846  HGB  --  12.9  --   --   --  11.8*  HCT  --  42.5  --   --   --  38.7  PLT  --  235  --   --   --  223  HEPARINUNFRC  --   --  0.85*  --   --  0.70  CREATININE  --  1.72*  --   --   --   --   TROPONINI 0.18*  --   --  0.26* 0.15*  --     Assessment: 68yo female on heparin for PE  Heparin level on higher end of therapeutic range at 0.7 on 1100 units/hr. Hgb dropped to 11.8, plts ok, SCR 1.72, no reports of bleeding or infusion related issues per RN.  Goal of Therapy:  Heparin level 0.3-0.7 units/ml   Plan:  Decrease heparin to 1050 units/hr  Heparin level in 8 hours Monitor daily heparin level, CBC, s/sx bleeding F/u plans for PO [facility]  Lenward Chancellor, PharmD PGY1 Pharmacy Resident Phone [phone] 07/06/2018 10:02 AM

## 2018-07-07 LAB — CBC WITH DIFFERENTIAL/PLATELET
Abs Immature Granulocytes: 0.01 10*3/uL (ref 0.00–0.07)
Basophils Absolute: 0.1 10*3/uL (ref 0.0–0.1)
Basophils Relative: 1 %
Eosinophils Absolute: 0.3 10*3/uL (ref 0.0–0.5)
Eosinophils Relative: 4 %
HCT: 38.9 % (ref 36.0–46.0)
Hemoglobin: 11.9 g/dL — ABNORMAL LOW (ref 12.0–15.0)
Immature Granulocytes: 0 %
Lymphocytes Relative: 48 %
Lymphs Abs: 4 10*3/uL (ref 0.7–4.0)
MCH: 25.6 pg — ABNORMAL LOW (ref 26.0–34.0)
MCHC: 30.6 g/dL (ref 30.0–36.0)
MCV: 83.7 fL (ref 80.0–100.0)
Monocytes Absolute: 0.6 10*3/uL (ref 0.1–1.0)
Monocytes Relative: 8 %
Neutro Abs: 3.1 10*3/uL (ref 1.7–7.7)
Neutrophils Relative %: 39 %
Platelets: 217 10*3/uL (ref 150–400)
RBC: 4.65 MIL/uL (ref 3.87–5.11)
RDW: 14.1 % (ref 11.5–15.5)
WBC: 8.1 10*3/uL (ref 4.0–10.5)
nRBC: 0 % (ref 0.0–0.2)

## 2018-07-07 LAB — RENAL FUNCTION PANEL
Albumin: 3.3 g/dL — ABNORMAL LOW (ref 3.5–5.0)
Anion gap: 7 (ref 5–15)
BUN: 17 mg/dL (ref 8–23)
CO2: 26 mmol/L (ref 22–32)
Calcium: 9.2 mg/dL (ref 8.9–10.3)
Chloride: 107 mmol/L (ref 98–111)
Creatinine, Ser: 1.27 mg/dL — ABNORMAL HIGH (ref 0.44–1.00)
GFR calc Af Amer: 51 mL/min — ABNORMAL LOW (ref >60)
GFR calc non Af Amer: 44 mL/min — ABNORMAL LOW (ref >60)
Glucose, Bld: 114 mg/dL — ABNORMAL HIGH (ref 70–99)
Phosphorus: 3.1 mg/dL (ref 2.5–4.6)
Potassium: 4.1 mmol/L (ref 3.5–5.1)
Sodium: 140 mmol/L (ref 135–145)

## 2018-07-07 LAB — MAGNESIUM: Magnesium: 2.4 mg/dL (ref 1.7–2.4)

## 2018-07-07 LAB — HEPARIN LEVEL (UNFRACTIONATED): HEPARIN UNFRACTIONATED: 0.53 [IU]/mL (ref 0.30–0.70)

## 2018-07-07 NOTE — Progress Notes (Signed)
PROGRESS NOTE    Denise Haynes  ZOX:[id number] DOB: [birth year] DOA: 07/05/2018 PCP: System, Pcp Not In  Outpatient Specialists:   Brief Narrative: As per H&P done by Dr. Lorretta Harp "Denise Haynes is a 68 y.o. female with medical history significant of hypertension, hyperlipidemia, chronic leg edema, who presents with shortness of breath.  Patient states that she has been having shortness of breath, particularly on exertion in the past 2 days, which has been progressively worsening.  She also has generalized weakness, malaise, and palpitation. No fever or chills.  She has dry cough, but no chest pain.  Denies recent long distant traveling.  No tenderness in the calf areas.  Patient does not have nausea, vomiting, diarrhea, abdominal pain, symptoms of UTI or unilateral weakness.  No family history of blood clot.  ED Course: pt was found to have WBC 6.5, troponin 0.18-->0.26, AKI with creatinine 1.72, BUN 32, temperature normal, tachycardia, tachypnea, oxygen saturation 95-100% on room air.  Chest x-ray negative.  CT angiogram of chest showed bilateral PE with evidence of right heart strain.  Patient is admitted to stepdown bed as inpatient".  07/06/2018: Patient seen.  Also discussed with patient's daughter over the phone.  Explained patient's condition to patient and the patient's daughter, and answered all their questions.  Shortness of breath is improving.  We will continue IV heparin for the next 24 to 48 hours.  Likely discharge back home on Eliquis or Xarelto on 07/08/2018.  Continue to monitor closely.  There seems not to be any provocative factor for the severe thromboembolic disease.  07/07/2018: Patient seen.  No new complaints.  No shortness of breath.  No chest pain.  We will continue heparin for now.  Likely DC tomorrow on Eliquis.  Assessment & Plan:   Principal Problem:   Bilateral pulmonary embolism (HCC) Active Problems:   HTN (hypertension)   HLD (hyperlipidemia)   AKI (acute  kidney injury) (HCC)   Elevated troponin   Bilateral pulmonary embolism with right heart strain:  -CT angiogram showed bilateral PE with CT evidence of right heart strain.   -Patient remains hemodynamically stable.   No shortness of breath.   Continue IV heparin.   Likely transition to Eliquis in the morning. We will consult case management to ensure patient can afford Eliquis..  Elevated troponin:  Troponin elevation is likely secondary to the severity of pulmonary embolism.  Primary prognostic significance.    HTN (hypertension): -PRN hydralazine -Hold home torsemide and Maxide due to AKI -Will decrease amlodipine to 2.5 Mg p.o. once daily. -Continue to monitor blood pressure closely.  HLD (hyperlipidemia): -Pravastatin  AKI Versus AKI on CKD versus CKD: Serum creatinine today is 1.48. Continue to monitor closely.  Continue to avoid nephrotoxic medications. 07/07/2018: Serum creatinine is down to 1.27 today.  AKI has continued to improve.  DVT prophylaxis: IV heparin Code Status: Full code Family Communication: Patient's daughter Disposition Plan: Home eventually   Consultants:   None  Procedures:   None  Antimicrobials:   None   Subjective: Shortness of breath is improving. No chest pain.  Objective: Vitals:   07/06/18 0416 07/06/18 2001 07/07/18 0349 07/07/18 1508  BP: (!) 84/54 (!) 123/54 (!) 141/60 119/81  Pulse: 93 84 82 87  Resp: 18 16 14 14   Temp: 97.8 F (36.6 C) 97.6 F (36.4 C) 97.9 F (36.6 C) 97.8 F (36.6 C)  TempSrc: Oral Oral Oral Oral  SpO2: 97% 100% 96% 98%  Weight: 99.8 kg  98.7 kg  Height:        Intake/Output Summary (Last 24 hours) at 07/07/2018 1712 Last data filed at 07/07/2018 1318 Gross per 24 hour  Intake 398.2 ml  Output -  Net 398.2 ml   Filed Weights   07/05/18 1335 07/06/18 0416 07/07/18 0349  Weight: 88.5 kg 99.8 kg 98.7 kg    Examination:  General exam: Appears calm and comfortable.  Moderately  obese. Respiratory system: Clear to auscultation. Cardiovascular system: S1 & S2 heard. Gastrointestinal system: Obese, soft and nontender.  Organs are difficult to assess.   Central nervous system: Alert and oriented.  Patient moves all limbs. Extremities: No leg edema.  Data Reviewed: I have personally reviewed following labs and imaging studies  CBC: Recent Labs  Lab 07/05/18 1416 07/06/18 0846 07/07/18 0344  WBC 6.5 7.2 8.1  NEUTROABS  --   --  3.1  HGB 12.9 11.8* 11.9*  HCT 42.5 38.7 38.9  MCV 85.3 84.1 83.7  PLT 235 223 217   Basic Metabolic Panel: Recent Labs  Lab 07/05/18 1416 07/06/18 0846 07/07/18 0344  NA 136 140 140  K 3.8 4.3 4.1  CL 104 106 107  CO2 25 26 26   GLUCOSE 165* 101* 114*  BUN 32* 24* 17  CREATININE 1.72* 1.48* 1.27*  CALCIUM 9.0 8.9 9.2  MG  --   --  2.4  PHOS  --   --  3.1   GFR: Estimated Creatinine Clearance: 48.8 mL/min (A) (by C-G formula based on SCr of 1.27 mg/dL (H)). Liver Function Tests: Recent Labs  Lab 07/07/18 0344  ALBUMIN 3.3*   No results for input(s): LIPASE, AMYLASE in the last 168 hours. No results for input(s): AMMONIA in the last 168 hours. Coagulation Profile: No results for input(s): INR, PROTIME in the last 168 hours. Cardiac Enzymes: Recent Labs  Lab 07/05/18 1358 07/05/18 2217 07/06/18 0232 07/06/18 0846 07/06/18 1405  TROPONINI 0.18* 0.26* 0.15* 0.11* 0.08*   BNP (last 3 results) No results for input(s): PROBNP in the last 8760 hours. HbA1C: Recent Labs    07/06/18 0232  HGBA1C 6.7*   CBG: No results for input(s): GLUCAP in the last 168 hours. Lipid Profile: Recent Labs    07/06/18 0232  CHOL 183  HDL 47  LDLCALC 110*  TRIG 130  CHOLHDL 3.9   Thyroid Function Tests: No results for input(s): TSH, T4TOTAL, FREET4, T3FREE, THYROIDAB in the last 72 hours. Anemia Panel: No results for input(s): VITAMINB12, FOLATE, FERRITIN, TIBC, IRON, RETICCTPCT in the last 72 hours. Urine analysis:  No results found for: COLORURINE, APPEARANCEUR, LABSPEC, PHURINE, GLUCOSEU, HGBUR, BILIRUBINUR, KETONESUR, PROTEINUR, UROBILINOGEN, NITRITE, LEUKOCYTESUR Sepsis Labs: @LABRCNTIP (procalcitonin:4,lacticidven:4)  )No results found for this or any previous visit (from the past 240 hour(s)).       Radiology Studies: No results found.      Scheduled Meds: . amLODipine  2.5 mg Oral Daily  . aspirin EC  81 mg Oral Daily  . pravastatin  20 mg Oral q1800   Continuous Infusions: . sodium chloride Stopped (07/06/18 0158)  . heparin 1,050 Units/hr (07/07/18 0314)     LOS: 2 days    Time spent: 25 minutes.    Berton Mount, MD  [facility] Pager #: [phone] 7PM-7AM contact night coverage as above

## 2018-07-07 NOTE — Progress Notes (Signed)
ANTICOAGULATION CONSULT NOTE - Initial Consult  Pharmacy Consult for heparin Indication: pulmonary embolus  Allergies  Allergen Reactions  . Sulfa Antibiotics Rash    Patient Measurements: Height: 5\' 3"  (160 cm) Weight: 217 lb 8 oz (98.7 kg) IBW/kg (Calculated) : 52.4 Heparin Dosing Weight: 72.4kg  Vital Signs: Temp: 97.9 F (36.6 C) (03/22 0349) Temp Source: Oral (03/22 0349) BP: 141/60 (03/22 0349) Pulse Rate: 82 (03/22 0349)  Labs: Recent Labs    07/05/18 1416  07/06/18 0232 07/06/18 0846 07/06/18 1405 07/06/18 1821 07/07/18 0344  HGB 12.9  --   --  11.8*  --   --  11.9*  HCT 42.5  --   --  38.7  --   --  38.9  PLT 235  --   --  223  --   --  217  HEPARINUNFRC  --    < >  --  0.70  --  0.50 0.53  CREATININE 1.72*  --   --  1.48*  --   --  1.27*  TROPONINI  --    < > 0.15* 0.11* 0.08*  --   --    < > = values in this interval not displayed.    Estimated Creatinine Clearance: 48.8 mL/min (A) (by C-G formula based on SCr of 1.27 mg/dL (H)).   Medical History: Past Medical History:  Diagnosis Date  . Hypertension     Assessment: 52 yoF admitted with bilateral PEs on CT. Pt on no anticoagulation at home, started on IV heparin. Heparin level is therapeutic, CBC stable, planning to transition to a DOAC tomorrow.  Goal of Therapy:  Heparin level 0.3-0.7 units/ml Monitor platelets by anticoagulation protocol: Yes   Plan:  -Heparin 1050 units/hr -Daily heparin level and CBC  Fredonia Highland, PharmD, BCPS Clinical Pharmacist [phone] Please check AMION for all [facility] Pharmacy numbers 07/07/2018

## 2018-07-08 LAB — CBC
HCT: 39.4 % (ref 36.0–46.0)
Hemoglobin: 12.1 g/dL (ref 12.0–15.0)
MCH: 25.6 pg — ABNORMAL LOW (ref 26.0–34.0)
MCHC: 30.7 g/dL (ref 30.0–36.0)
MCV: 83.5 fL (ref 80.0–100.0)
Platelets: 224 10*3/uL (ref 150–400)
RBC: 4.72 MIL/uL (ref 3.87–5.11)
RDW: 14 % (ref 11.5–15.5)
WBC: 7.7 10*3/uL (ref 4.0–10.5)
nRBC: 0 % (ref 0.0–0.2)

## 2018-07-08 LAB — HEPARIN LEVEL (UNFRACTIONATED)
Heparin Unfractionated: 0.25 IU/mL — ABNORMAL LOW (ref 0.30–0.70)
Heparin Unfractionated: 0.25 IU/mL — ABNORMAL LOW (ref 0.30–0.70)

## 2018-07-08 MED ORDER — HEPARIN BOLUS VIA INFUSION
1000.0000 [IU] | Freq: Once | INTRAVENOUS | Status: AC
  Administered 2018-07-08: 1000 [IU] via INTRAVENOUS
  Filled 2018-07-08: qty 1000

## 2018-07-08 MED ORDER — ACETAMINOPHEN 325 MG PO TABS: 650.0000 mg | ORAL_TABLET | Freq: Four times a day (QID) | ORAL | Status: AC | PRN

## 2018-07-08 MED ORDER — APIXABAN 5 MG PO TABS: 5.0000 mg | ORAL_TABLET | Freq: Two times a day (BID) | ORAL | Status: DC

## 2018-07-08 MED ORDER — APIXABAN 5 MG PO TABS
10.0000 mg | ORAL_TABLET | Freq: Two times a day (BID) | ORAL | Status: DC
  Administered 2018-07-08: 10 mg via ORAL
  Filled 2018-07-08: qty 2

## 2018-07-08 MED ORDER — ELIQUIS 5 MG VTE STARTER PACK: ORAL_TABLET | ORAL | 0 refills | Status: AC

## 2018-07-08 MED ORDER — ACETAMINOPHEN 325 MG PO TABS: 650.0000 mg | ORAL_TABLET | Freq: Four times a day (QID) | ORAL | Status: DC | PRN

## 2018-07-08 NOTE — Discharge Instructions (Addendum)
Your kidney function has been mildly impaired, likely related to your acute pulmonary emboli. Your kidney function is improving at the time of your discharge, but to assure the kidneys are given the best opportunity to fully recover I am asking you to avoid indomethacin or NSAIDs until a f/u check of your kidney function can be accomplished by your primary care provider in a week.    Information on my medicine - ELIQUIS (apixaban)  Why was Eliquis prescribed for you? Eliquis was prescribed to treat blood clots that may have been found in the veins of your legs (deep vein thrombosis) or in your lungs (pulmonary embolism) and to reduce the risk of them occurring again.  What do You need to know about Eliquis ? The starting dose is 10 mg (two 5 mg tablets) taken TWICE daily for the FIRST SEVEN (7) DAYS, then on 07/15/18  the dose is reduced to ONE 5 mg tablet taken TWICE daily.  Eliquis may be taken with or without food.   Try to take the dose about the same time in the morning and in the evening. If you have difficulty swallowing the tablet whole please discuss with your pharmacist how to take the medication safely.  Take Eliquis exactly as prescribed and DO NOT stop taking Eliquis without talking to the doctor who prescribed the medication.  Stopping may increase your risk of developing a new blood clot.  Refill your prescription before you run out.  After discharge, you should have regular check-up appointments with your healthcare provider that is prescribing your Eliquis.    What do you do if you miss a dose? If a dose of ELIQUIS is not taken at the scheduled time, take it as soon as possible on the same day and twice-daily administration should be resumed. The dose should not be doubled to make up for a missed dose.  Important Safety Information A possible side effect of Eliquis is bleeding. You should call your healthcare provider right away if you experience any of the  following: ? Bleeding from an injury or your nose that does not stop. ? Unusual colored urine (red or dark brown) or unusual colored stools (red or black). ? Unusual bruising for unknown reasons. ? A serious fall or if you hit your head (even if there is no bleeding).  Some medicines may interact with Eliquis and might increase your risk of bleeding or clotting while on Eliquis. To help avoid this, consult your healthcare provider or pharmacist prior to using any new prescription or non-prescription medications, including herbals, vitamins, non-steroidal anti-inflammatory drugs (NSAIDs) and supplements.  This website has more information on Eliquis (apixaban): http://www.eliquis.com/eliquis/home\   Pulmonary Embolism  A pulmonary embolism (PE) is a sudden blockage or decrease of blood flow in one lung or both lungs. Most blockages come from a blood clot that forms in a lower leg, thigh, or arm vein (deep vein thrombosis, DVT) and travels to the lungs. A clot is blood that has thickened into a gel or solid. PE is a dangerous and life-threatening condition that needs to be treated right away. What are the causes? This condition is usually caused by a blood clot that forms in a vein and moves to the lungs. In rare cases, it may be caused by air, fat, part of a tumor, or other tissue that moves through the veins and into the lungs. What increases the risk? The following factors may make you more likely to develop this condition:  Traumatic injury,  such as breaking a hip or leg.  Spinal cord injury.  Orthopedic surgery, especially hip or knee replacement.  Any major surgery.  Stroke.  Having DVT.  Blood clots or blood clotting disease.  Long-term (chronic) lung or heart disease.  Taking medicines that contain estrogen. These include birth control pills and hormone replacement therapy.  Cancer and chemotherapy.  Having a central venous catheter.  Pregnancy and the period of time  after delivery (postpartum).  Being older than age 26.  Being overweight.  Smoking. What are the signs or symptoms? Symptoms of this condition usually start suddenly and include:  Shortness of breath during activity or at rest.  Coughing or coughing up blood or blood-tinged mucus.  Chest pain that is often worse with deep breaths.  Rapid or irregular heartbeat.  Feeling light-headed or dizzy.  Fainting.  Feeling anxious.  Fever.  Sweating.  Pain and swelling in a leg. This is a symptom of DVT, which can lead to PE. How is this diagnosed? This condition may be diagnosed based on:  Your medical history.  A physical exam.  Blood tests.  CT pulmonary angiogram. This test checks blood flow in and around your lungs.  Ventilation-perfusion scan, also called a lung VQ scan. This test measures air flow and blood flow to the lungs.  Ultrasound of the legs. How is this treated? Treatment for this condition depends on many factors, such as the cause of your PE, your risk for bleeding or developing more clots, and other medical conditions you have. Treatment aims to remove, dissolve, or stop blood clots from forming or growing larger. Treatment may include:  Medicines, such as: ? Blood thinning medicines (anticoagulants) to stop clots from forming or growing. ? Medicines that dissolve clots (thrombolytics).  Procedures, such as: ? Using a flexible tube to remove a blood clot (embolectomy) or deliver medicine to destroy it (catheter-directed thrombolysis). ? Inserting a filter into a large vein that carries blood to the heart (inferior vena cava). This filter (vena cava filter) catches blood clots before they reach the lungs. ? Surgery to remove the clot (surgical embolectomy). This is rare. You may need a combination of immediate, long-term (up to 3 months after diagnosis), and extended (more than 3 months after diagnosis) treatments. Your treatment may continue for several  months (maintenance therapy). You and your health care provider will work together to choose the treatment program that is best for you. Follow these instructions at home: Medicines  Take over-the-counter and prescription medicines only as told by your health care provider.  If you are taking an anticoagulant medicine: ? Take the medicine every day at the same time each day. ? Understand what foods and drugs interact with your medicine. ? Understand the side effects of this medicine, including excessive bruising or bleeding. Ask your health care provider or pharmacist about other side effects. General instructions  Wear a medical alert bracelet or carry a medical alert card that says you have had a PE and lists what medicines you take.  Ask your health care provider when you may return to your normal activities. Avoid sitting or lying for a long time without moving.  Maintain a healthy weight. Ask your health care provider what weight is healthy for you.  Do not use any products that contain nicotine or tobacco, such as cigarettes and e-cigarettes. If you need help quitting, ask your health care provider.  Talk with your health care provider about any travel plans. It is important to  make sure that you are still able to take your medicine while on trips.  Keep all follow-up visits as told by your health care provider. This is important. Contact a health care provider if:  You missed a dose of your blood thinner medicine. Get help right away if:  You have: ? New or increased pain, swelling, warmth, or redness in an arm or leg. ? Numbness or tingling in an arm or leg. ? Shortness of breath during activity or at rest. ? A fever. ? Chest pain. ? A rapid or irregular heartbeat. ? A severe headache. ? Vision changes. ? A serious fall or accident, or you hit your head. ? Stomach (abdominal) pain. ? Blood in your vomit, stool, or urine. ? A cut that will not stop bleeding.  You  cough up blood.  You feel light-headed or dizzy.  You cannot move your arms or legs.  You are confused or have memory loss. These symptoms may represent a serious problem that is an emergency. Do not wait to see if the symptoms will go away. Get medical help right away. Call your local emergency services (911 in the [place] Do not drive yourself to the hospital. Summary  A pulmonary embolism (PE) is a sudden blockage or decrease of blood flow in one lung or both lungs. PE is a dangerous and life-threatening condition that needs to be treated right away.  Treatments for this condition usually include medicines to thin your blood (anticoagulants) or medicines to break apart blood clots (thrombolytics).  If you are given blood thinners, it is important to take the medicine every single day at the same time each day.  If you have signs of PE or DVT, call your local emergency services (911 in the [place] This information is not intended to replace advice given to you by your health care provider. Make sure you discuss any questions you have with your health care provider. Document Released: 03/31/2000 Document Revised: 11/16/2017 Document Reviewed: 05/17/2017 Elsevier Interactive Patient Education  2019 [employer].

## 2018-07-08 NOTE — Progress Notes (Signed)
ANTICOAGULATION CONSULT NOTE - Follow Up Consult  Pharmacy Consult for Heparin>>apixaban Indication: pulmonary embolus  Allergies  Allergen Reactions  . Sulfa Antibiotics Rash    Patient Measurements: Height: 5\' 3"  (160 cm) Weight: 215 lb 4.8 oz (97.7 kg) IBW/kg (Calculated) : 52.4 Heparin Dosing Weight: 75.2 kg  Vital Signs: Temp: 98.3 F (36.8 C) (03/23 1403) Temp Source: Oral (03/23 1403) BP: 136/70 (03/23 1403) Pulse Rate: 71 (03/23 1403)  Labs: Recent Labs    07/06/18 0232  07/06/18 0846 07/06/18 1405  07/07/18 0344 07/08/18 0351 07/08/18 1307  HGB  --    < > 11.8*  --   --  11.9* 12.1  --   HCT  --   --  38.7  --   --  38.9 39.4  --   PLT  --   --  223  --   --  217 224  --   HEPARINUNFRC  --   --  0.70  --    < > 0.53 0.25* 0.25*  CREATININE  --   --  1.48*  --   --  1.27*  --   --   TROPONINI 0.15*  --  0.11* 0.08*  --   --   --   --    < > = values in this interval not displayed.    Estimated Creatinine Clearance: 48.5 mL/min (A) (by C-G formula based on SCr of 1.27 mg/dL (H)).   Medications:  Scheduled:  . amLODipine  2.5 mg Oral Daily  . apixaban  10 mg Oral BID  . aspirin EC  81 mg Oral Daily  . pravastatin  20 mg Oral q1800    Assessment: 3 YOF with bilateral PE on CT, not on anticoagulation PTA.  Heparin level remains at 0.25 subtherapeutic despite increasing heparin rate to 1150 units/hr.  CBC wnl, No bleeding noted.  No issues with heparin infusion noted per RN.  RN reports possible change to Elquis treatment dose today.     Goal of Therapy:  Heparin level 0.3-0.7 units/ml Monitor platelets by anticoagulation protocol: Yes   Plan:  Transition to apixaban 10mg  bid x 7d then 5mg  bid Will give dose now  Sheppard Coil PharmD., BCPS Clinical Pharmacist 07/08/2018 3:41 PM

## 2018-07-08 NOTE — Progress Notes (Signed)
ANTICOAGULATION CONSULT NOTE - Follow Up Consult  Pharmacy Consult for heparin Indication: pulmonary embolus  Labs: Recent Labs    07/05/18 1416  07/06/18 0232 07/06/18 0846 07/06/18 1405 07/06/18 1821 07/07/18 0344 07/08/18 0351  HGB 12.9  --   --  11.8*  --   --  11.9* 12.1  HCT 42.5  --   --  38.7  --   --  38.9 39.4  PLT 235  --   --  223  --   --  217 224  HEPARINUNFRC  --    < >  --  0.70  --  0.50 0.53 0.25*  CREATININE 1.72*  --   --  1.48*  --   --  1.27*  --   TROPONINI  --    < > 0.15* 0.11* 0.08*  --   --   --    < > = values in this interval not displayed.    Assessment: 68yo female subtherapeutic on heparin after several levels at goal; no gtt issues or signs of bleeding per RN.  Goal of Therapy:  Heparin level 0.3-0.7 units/ml   Plan:  Will increase heparin gtt by 1 units/kg/hr to 1150 units/hr and check level in 8 hours.    Vernard Gambles, PharmD, BCPS  07/08/2018,5:29 AM

## 2018-07-08 NOTE — TOC Benefit Eligibility Note (Signed)
Transition of Care [facility]) Benefit Eligibility Note    Patient Details  Name: Denise Haynes MRN: [id number] Date of Birth: 10-24-1950   Medication/Dose: ELIQUIS  2.5 MG BID AND ELIQUIS  5 MG BID  Covered?: Yes  Tier: 3 Drug  Prescription Coverage Preferred Pharmacy: [facility] _ [facility], [facility], [facility]  Spoke with Person/Company/Phone Number:: EUNICE @ [employer] # [phone]  Co-Pay: $ 139.00  Prior Approval: No  Deductible: Unmet  Additional Notes: ELIQUIS  10 MG -NON-FORMULARY    Mardene Sayer Phone Number: 07/08/2018, 5:01 PM

## 2018-07-08 NOTE — Discharge Summary (Signed)
DISCHARGE SUMMARY  Denise Haynes  MR#: [id number]  DOB:02/23/1951  Date of Admission: 07/05/2018 Date of Discharge: 07/08/2018  Attending Physician:Adriel Desrosier Silvestre Gunner, MD  Patient's QMV:HQIONG, Pcp Not In  Consults: none  Disposition: D/C home  Follow-up Appts: Follow-up Information    Your Primary Care Provider Follow up.   Why:  See your Primary Care Provider for a follow up visit in one week. A telemedicine visit will suffice, but you will need to have lab work drawn per your PCP to assure your kideny function has fully recovered.           Tests Needing Follow-up: -assess tolerance of newly prescribed DOAC -needs f/u BMET in 5-7 days -a comprehensive review of risk factors, and an assessment for genetic predisposition to DVT/PE will need to be completed in the outpt setting, to include assuring screening for occult malignancy is up to date  Discharge Diagnoses: Bilateral pulmonary emboli with right heart strain Elevated troponin HTN  HLD Acute kidney injury  Initial presentation: 68 y.o.femalewith medical history significant ofhypertension, hyperlipidemia, chronic leg edema, who presents with shortness of breath. Patient states that she has been having shortness of breath, particularly on exertion in the past 2 days, which hasbeenprogressively worsening. She also has generalized weakness, malaise, and palpitation. No fever or chills. She has dry cough, but no chest pain. Denies recent long distanttraveling. No tenderness in the calf areas. Patient does not have nausea,vomiting, diarrhea, abdominal pain, symptoms of UTI or unilateral weakness. No family history of blood clot.  In the ED pt was found to have WBC 6.5, troponin 0.18-->0.26, AKI with creatinine 1.72, BUN 32, temperature normal, tachycardia, tachypnea, oxygen saturation95-100% on room air.Chest x-ray negative. CT angiogram of chest showed bilateral PE with evidence of right heart strain.    Hospital Course:  Bilateral pulmonary emboli with right heart strain -CT angiogram showed bilateral PE with CT evidence of right heart strain -Patient remained hemodynamically stable and did not require supplemental O2 -transitioned from IV heparin to Eliquis w/o difficulty -provided w/ card for first Rx of Eliquis free of charge - pt confirmed she has insurance coverage for subsequent Rxs -a comprehensive review of risk factors, and an assessment for genetic predisposition to DVT/PE will need to be completed in the outpt setting, to include assuring screening for occult malignancy is up to date   Elevated troponin secondary to pulmonary emboli - no chest pain at time of d/c    HTN  BP controlled at time of d/c   HLD Pravastatin to continue   Acute kidney injury crt trending down at time of d/c - pt instructed to temporarily avoid NSAIDs - needs f/u BMET in 5-7 days   Allergies as of 07/08/2018      Reactions   Sulfa Antibiotics Rash      Medication List    STOP taking these medications   ibuprofen 200 MG tablet Commonly known as:  ADVIL,MOTRIN   indomethacin 25 MG capsule Commonly known as:  INDOCIN     TAKE these medications   acetaminophen 325 MG tablet Commonly known as:  TYLENOL Take 2 tablets (650 mg total) by mouth every 6 (six) hours as needed for mild pain (or Fever >/= 101).   amphetamine-dextroamphetamine 30 MG tablet Commonly known as:  ADDERALL Take 30 mg by mouth daily as needed (to focus at work).   Eliquis DVT/PE Starter Pack 5 MG Tabs Take as directed on package: start with two-5mg  tablets twice daily for 7 days. On  day 8, switch to one-5mg  tablet twice daily.   lisinopril 20 MG tablet Commonly known as:  PRINIVIL,ZESTRIL Take 10 mg by mouth daily.   Potassium Chloride ER 20 MEQ Tbcr Take 10 mEq by mouth daily.   traMADol 50 MG tablet Commonly known as:  ULTRAM Take 50 mg by mouth 2 (two) times daily.    triamterene-hydrochlorothiazide 37.5-25 MG tablet Commonly known as:  MAXZIDE-25 Take 1 tablet by mouth daily.   VITAMIN B-12 PO Take 1 tablet by mouth daily.   vitamin C 500 MG tablet Commonly known as:  ASCORBIC ACID Take 500 mg by mouth daily.   Vitamin D3 50 MCG (2000 UT) Tabs Take 2,000 Units by mouth daily.       Day of Discharge BP 136/70 (BP Location: Right Arm)   Pulse 71   Temp 98.3 F (36.8 C) (Oral)   Resp 16   Ht 5\' 3"  (1.6 m)   Wt 97.7 kg   SpO2 99%   BMI 38.14 kg/m   Physical Exam: General: No acute respiratory distress Lungs: Clear to auscultation bilaterally without wheezes or crackles Cardiovascular: Regular rate and rhythm without murmur gallop or rub normal S1 and S2 Abdomen: Nontender, nondistended, soft, bowel sounds positive, no rebound, no ascites, no appreciable mass Extremities: No significant cyanosis, clubbing, or edema bilateral lower extremities  Basic Metabolic Panel: Recent Labs  Lab 07/05/18 1416 07/06/18 0846 07/07/18 0344  NA 136 140 140  K 3.8 4.3 4.1  CL 104 106 107  CO2 25 26 26   GLUCOSE 165* 101* 114*  BUN 32* 24* 17  CREATININE 1.72* 1.48* 1.27*  CALCIUM 9.0 8.9 9.2  MG  --   --  2.4  PHOS  --   --  3.1    Liver Function Tests: Recent Labs  Lab 07/07/18 0344  ALBUMIN 3.3*   CBC: Recent Labs  Lab 07/05/18 1416 07/06/18 0846 07/07/18 0344 07/08/18 0351  WBC 6.5 7.2 8.1 7.7  NEUTROABS  --   --  3.1  --   HGB 12.9 11.8* 11.9* 12.1  HCT 42.5 38.7 38.9 39.4  MCV 85.3 84.1 83.7 83.5  PLT 235 223 217 224    Cardiac Enzymes: Recent Labs  Lab 07/05/18 1358 07/05/18 2217 07/06/18 0232 07/06/18 0846 07/06/18 1405  TROPONINI 0.18* 0.26* 0.15* 0.11* 0.08*   BNP (last 3 results) Recent Labs    07/06/18 0232  BNP 82.5    Time spent in discharge (includes decision making & examination of pt): 35 minutes  07/08/2018, 4:27 PM   Lonia Blood, MD [facility] Office  [phone]

## 2018-07-08 NOTE — Progress Notes (Signed)
ANTICOAGULATION CONSULT NOTE - Follow Up Consult  Pharmacy Consult for Heparin Indication: pulmonary embolus  Allergies  Allergen Reactions  . Sulfa Antibiotics Rash    Patient Measurements: Height: 5\' 3"  (160 cm) Weight: 215 lb 4.8 oz (97.7 kg) IBW/kg (Calculated) : 52.4 Heparin Dosing Weight: 75.2 kg  Vital Signs: Temp: 98.3 F (36.8 C) (03/23 0431) Temp Source: Oral (03/23 0431) BP: 127/61 (03/23 0845) Pulse Rate: 99 (03/23 0431)  Labs: Recent Labs    07/05/18 1416  07/06/18 0232 07/06/18 0846 07/06/18 1405  07/07/18 0344 07/08/18 0351 07/08/18 1307  HGB 12.9  --   --  11.8*  --   --  11.9* 12.1  --   HCT 42.5  --   --  38.7  --   --  38.9 39.4  --   PLT 235  --   --  223  --   --  217 224  --   HEPARINUNFRC  --    < >  --  0.70  --    < > 0.53 0.25* 0.25*  CREATININE 1.72*  --   --  1.48*  --   --  1.27*  --   --   TROPONINI  --    < > 0.15* 0.11* 0.08*  --   --   --   --    < > = values in this interval not displayed.    Estimated Creatinine Clearance: 48.5 mL/min (A) (by C-G formula based on SCr of 1.27 mg/dL (H)).   Medications:  Scheduled:  . amLODipine  2.5 mg Oral Daily  . aspirin EC  81 mg Oral Daily  . pravastatin  20 mg Oral q1800    Assessment: 52 YOF with bilateral PE on CT, not on anticoagulation PTA.  Heparin level remains at 0.25 subtherapeutic despite increasing heparin rate to 1150 units/hr.  CBC wnl, No bleeding noted.  No issues with heparin infusion noted per RN.  RN reports possible change to Elquis treatment dose today.     Goal of Therapy:  Heparin level 0.3-0.7 units/ml Monitor platelets by anticoagulation protocol: Yes   Plan:  Give heparin bolus 1000 units IV x 1  Increase heparin drip to 1300 units/hr Check  6 hour heparin level Daily heparin level and CBC.  F/u plan for transition to  DOAC  Noah Delaine, RPh Clinical Pharmacist [phone] Please check AMION for all [facility] Pharmacy phone numbers After 10:00 PM, call Main  Pharmacy [phone] 07/08/2018,2:02 PM

## 2018-07-08 NOTE — Care Management Important Message (Signed)
Important Message  Patient Details  Name: Denise Haynes MRN: [id number] Date of Birth: 12-27-1950   Medicare Important Message Given:  Yes    Dorena Bodo 07/08/2018, 4:24 PM

## 2018-07-09 NOTE — TOC Progression Note (Signed)
Transition of Care [facility]) - Progression Note    Patient Details  Name: Denise Haynes MRN: [id number] Date of Birth: 09/23/1950  [facility]) CM/SW Contact  Zenon Mayo, RN Phone Number: 07/09/2018, 9:18 AM  Clinical Narrative:    Patient discharged, NCM gave her the 30day free coupon, for eliquis, she has picked that up from the pharmacy, informed her that copay is around 139.00 since deductible has not been met.  Informed her once deductible is met that should go down.  She states she will call her insurance co. To see how much more she needs to pay for deductible.        Expected Discharge Plan and Services           Expected Discharge Date: 07/08/18                         Social Determinants of Health (SDOH) Interventions    Readmission Risk Interventions No flowsheet data found.

## 2018-07-12 NOTE — Care Management (Signed)
07-12-18 Patient called stating that she was d/c on Eliquis and now bleeding. Patient stated she does not have a PCP. CM did make patient aware to call [employer] to see if they can schedule an appointment with provider in network. CM did make patient aware since bleeding go to or call the nearest urgent care to see if they have a tele health visit established that she can connect to or she may have to visit a local ED. Patient was agitated that she was not set up with PCP at time of d/c. Patient states she will discuss this with administrator. No further needs from this Case Manager. Gala Lewandowsky, RN,BSN Case Manager

## 2018-08-12 ENCOUNTER — Other Ambulatory Visit: Payer: Self-pay

## 2018-08-12 ENCOUNTER — Emergency Department (HOSPITAL_BASED_OUTPATIENT_CLINIC_OR_DEPARTMENT_OTHER)
Admission: EM | Admit: 2018-08-12 | Discharge: 2018-08-12 | Disposition: A | Discharge status: Home / Self Care | Payer: Medicare HMO | Attending: Emergency Medicine | Admitting: Emergency Medicine

## 2018-08-12 ENCOUNTER — Emergency Department (HOSPITAL_BASED_OUTPATIENT_CLINIC_OR_DEPARTMENT_OTHER): Payer: Medicare HMO

## 2018-08-12 ENCOUNTER — Encounter (HOSPITAL_BASED_OUTPATIENT_CLINIC_OR_DEPARTMENT_OTHER): Payer: Self-pay | Admitting: Emergency Medicine

## 2018-08-12 DIAGNOSIS — R0602 Shortness of breath: Secondary | ICD-10-CM | POA: Diagnosis not present

## 2018-08-12 DIAGNOSIS — Z79899 Other long term (current) drug therapy: Secondary | ICD-10-CM | POA: Diagnosis not present

## 2018-08-12 DIAGNOSIS — E876 Hypokalemia: Secondary | ICD-10-CM | POA: Insufficient documentation

## 2018-08-12 DIAGNOSIS — I10 Essential (primary) hypertension: Secondary | ICD-10-CM | POA: Diagnosis not present

## 2018-08-12 DIAGNOSIS — Z7901 Long term (current) use of anticoagulants: Secondary | ICD-10-CM | POA: Insufficient documentation

## 2018-08-12 LAB — CBC WITH DIFFERENTIAL/PLATELET
Abs Immature Granulocytes: 0.01 10*3/uL (ref 0.00–0.07)
Basophils Absolute: 0 10*3/uL (ref 0.0–0.1)
Basophils Relative: 0 %
Eosinophils Absolute: 0.3 10*3/uL (ref 0.0–0.5)
Eosinophils Relative: 4 %
HCT: 43.3 % (ref 36.0–46.0)
Hemoglobin: 13.6 g/dL (ref 12.0–15.0)
Immature Granulocytes: 0 %
Lymphocytes Relative: 34 %
Lymphs Abs: 2.2 10*3/uL (ref 0.7–4.0)
MCH: 26.6 pg (ref 26.0–34.0)
MCHC: 31.4 g/dL (ref 30.0–36.0)
MCV: 84.7 fL (ref 80.0–100.0)
Monocytes Absolute: 0.8 10*3/uL (ref 0.1–1.0)
Monocytes Relative: 12 %
Neutro Abs: 3.3 10*3/uL (ref 1.7–7.7)
Neutrophils Relative %: 50 %
Platelets: 299 10*3/uL (ref 150–400)
RBC: 5.11 MIL/uL (ref 3.87–5.11)
RDW: 13.9 % (ref 11.5–15.5)
WBC: 6.6 10*3/uL (ref 4.0–10.5)
nRBC: 0 % (ref 0.0–0.2)

## 2018-08-12 LAB — PROTIME-INR
INR: 1.2 (ref 0.8–1.2)
Prothrombin Time: 14.7 seconds (ref 11.4–15.2)

## 2018-08-12 LAB — COMPREHENSIVE METABOLIC PANEL
ALT: 23 U/L (ref 0–44)
AST: 22 U/L (ref 15–41)
Albumin: 4.2 g/dL (ref 3.5–5.0)
Alkaline Phosphatase: 55 U/L (ref 38–126)
Anion gap: 11 (ref 5–15)
BUN: 19 mg/dL (ref 8–23)
CO2: 27 mmol/L (ref 22–32)
Calcium: 9.5 mg/dL (ref 8.9–10.3)
Chloride: 98 mmol/L (ref 98–111)
Creatinine, Ser: 1.12 mg/dL — ABNORMAL HIGH (ref 0.44–1.00)
GFR calc Af Amer: 59 mL/min — ABNORMAL LOW (ref >60)
GFR calc non Af Amer: 51 mL/min — ABNORMAL LOW (ref >60)
Glucose, Bld: 102 mg/dL — ABNORMAL HIGH (ref 70–99)
Potassium: 2.9 mmol/L — ABNORMAL LOW (ref 3.5–5.1)
Sodium: 136 mmol/L (ref 135–145)
Total Bilirubin: 0.3 mg/dL (ref 0.3–1.2)
Total Protein: 8 g/dL (ref 6.5–8.1)

## 2018-08-12 LAB — BRAIN NATRIURETIC PEPTIDE: B Natriuretic Peptide: 10.9 pg/mL (ref 0.0–100.0)

## 2018-08-12 LAB — TROPONIN I: Troponin I: <0.03 ng/mL (ref <0.03)

## 2018-08-12 MED ORDER — IOHEXOL 350 MG/ML SOLN
100.0000 mL | Freq: Once | INTRAVENOUS | Status: AC | PRN
  Administered 2018-08-12: 100 mL via INTRAVENOUS

## 2018-08-12 MED ORDER — POTASSIUM CHLORIDE 10 MEQ/100ML IV SOLN
10.0000 meq | Freq: Once | INTRAVENOUS | Status: AC
  Administered 2018-08-12: 10 meq via INTRAVENOUS
  Filled 2018-08-12: qty 100

## 2018-08-12 MED ORDER — SODIUM CHLORIDE 0.9 % IV SOLN
Freq: Once | INTRAVENOUS | Status: AC
  Administered 2018-08-12: 14:00:00 via INTRAVENOUS

## 2018-08-12 MED ORDER — POTASSIUM CHLORIDE CRYS ER 20 MEQ PO TBCR
40.0000 meq | EXTENDED_RELEASE_TABLET | Freq: Once | ORAL | Status: AC
  Administered 2018-08-12: 40 meq via ORAL
  Filled 2018-08-12: qty 2

## 2018-08-12 NOTE — ED Triage Notes (Signed)
Pt dx with PE (bil?) 1 month ago; started feeling SHOB about 3 days ago

## 2018-08-12 NOTE — ED Provider Notes (Signed)
[facility] EMERGENCY DEPARTMENT Provider Note   CSN: [id number] Arrival date & time: 08/12/18  1107    History   Chief Complaint Chief Complaint  Patient presents with  . Shortness of Breath    HPI Denise Haynes is a 68 y.o. female.     HPI   Denise Haynes is a 68 y.o. female, with a history of HTN, hyperlipidemia, and PE, presenting to the ED with shortness of breath for the past 3 days.  Intermittent palpitations.  Shortness of breath is typically present during "long conversations and with exertion."  At one point, she states her shortness of breath feels similar to when she presented with recent PEs, however, she also states it is a "rundown feeling" that she has had with low potassium in the past. Patient was admitted 07/05/2018, found to have bilateral PE with right heart strain and mildly positive troponin, and admitted for 3 days.  Discharged on Eliquis, which with she states compliance.  She states her shortness of breath from her PEs resolved while in the hospital on the heparin. Patient was discharged on Eliquis starter pack.  Her refill prescription of Eliquis states she should be taking 5 mg daily, however, she thought this was a misprint since her starter pack ended with her taking 5 mg twice daily.  Therefore, patient has continued to take 5 mg twice daily. Denies fever/chills, syncope, dizziness, diaphoresis, chest pain, cough, lower extremity swelling/pain, abdominal pain, or any other complaints.    Past Medical History:  Diagnosis Date  . Hypertension     Patient Active Problem List   Diagnosis Date Noted  . HTN (hypertension) 07/06/2018  . HLD (hyperlipidemia) 07/06/2018  . AKI (acute kidney injury) (HCC) 07/06/2018  . Bilateral pulmonary embolism (HCC) 07/05/2018    Past Surgical History:  Procedure Laterality Date  . CESAREAN SECTION       OB History   No obstetric history on file.      Home Medications    Prior to Admission  medications   Medication Sig Start Date End Date Taking? Authorizing Provider  allopurinol (ZYLOPRIM) 300 MG tablet Take 300 mg by mouth daily.   Yes [provider]  amLODipine (NORVASC) 10 MG tablet Take 10 mg by mouth daily.   Yes [provider]  metolazone (ZAROXOLYN) 5 MG tablet Take 5 mg by mouth daily.   Yes [provider]  acetaminophen (TYLENOL) 325 MG tablet Take 2 tablets (650 mg total) by mouth every 6 (six) hours as needed for mild pain (or Fever >/= 101). 07/08/18   Lonia Blood, MD  amphetamine-dextroamphetamine (ADDERALL) 30 MG tablet Take 30 mg by mouth daily as needed (to focus at work).  06/26/18   [provider]  Cholecalciferol (VITAMIN D3) 50 MCG (2000 UT) TABS Take 2,000 Units by mouth daily.    [provider]  Cyanocobalamin (VITAMIN B-12 PO) Take 1 tablet by mouth daily.    [provider]  Eliquis DVT/PE Starter Pack (ELIQUIS STARTER PACK) 5 MG TABS Take as directed on package: start with two-5mg  tablets twice daily for 7 days. On day 8, switch to one-5mg  tablet twice daily. 07/08/18   Lonia Blood, MD  lisinopril (PRINIVIL,ZESTRIL) 20 MG tablet Take 10 mg by mouth daily.    [provider]  Potassium Chloride ER 20 MEQ TBCR Take 10 mEq by mouth daily.     [provider]  traMADol (ULTRAM) 50 MG tablet Take 50 mg by mouth  2 (two) times daily.  09/24/17   [provider]  triamterene-hydrochlorothiazide (MAXZIDE-25) 37.5-25 MG tablet Take 1 tablet by mouth daily.  08/23/17   [provider]  vitamin C (ASCORBIC ACID) 500 MG tablet Take 500 mg by mouth daily.    [provider]    Family History Family History  Problem Relation Age of Onset  . Diabetes Mellitus II Mother   . Hypertension Mother   . Heart disease Father   . Diabetes Mellitus II Sister     Social History Social History   Tobacco Use  . Smoking status: Never Smoker  . Smokeless tobacco:  Never Used  Substance Use Topics  . Alcohol use: Not Currently  . Drug use: Never     Allergies   Sulfa antibiotics   Review of Systems Review of Systems  Constitutional: Positive for fatigue. Negative for chills, diaphoresis and fever.  Respiratory: Positive for shortness of breath. Negative for cough.   Cardiovascular: Positive for palpitations. Negative for chest pain and leg swelling.  Gastrointestinal: Negative for abdominal pain, blood in stool, diarrhea, nausea and vomiting.  Musculoskeletal: Negative for back pain and neck pain.  Neurological: Negative for dizziness, syncope, weakness, light-headedness and numbness.  Psychiatric/Behavioral: Negative for confusion.  All other systems reviewed and are negative.    Physical Exam Updated Vital Signs BP (!) 154/89 (BP Location: Right Arm)   Pulse (!) 109   Temp 98.5 F (36.9 C) (Oral)   Resp 18   SpO2 99%   Physical Exam Vitals signs and nursing note reviewed.  Constitutional:      General: She is not in acute distress.    Appearance: She is well-developed. She is not diaphoretic.  HENT:     Head: Normocephalic and atraumatic.     Mouth/Throat:     Mouth: Mucous membranes are moist.     Pharynx: Oropharynx is clear.  Eyes:     Conjunctiva/sclera: Conjunctivae normal.  Neck:     Musculoskeletal: Neck supple.  Cardiovascular:     Rate and Rhythm: Normal rate and regular rhythm.     Pulses: Normal pulses.          Radial pulses are 2+ on the right side and 2+ on the left side.       Dorsalis pedis pulses are 2+ on the right side and 2+ on the left side.       Posterior tibial pulses are 2+ on the right side and 2+ on the left side.     Heart sounds: Normal heart sounds.     Comments: Tactile temperature in the extremities appropriate and equal bilaterally. Pulse 96 during my exam. Pulmonary:     Effort: Pulmonary effort is normal. No respiratory distress.     Breath sounds: Normal breath sounds.  Abdominal:      Palpations: Abdomen is soft.     Tenderness: There is no abdominal tenderness. There is no guarding.  Musculoskeletal:     Right lower leg: No edema.     Left lower leg: No edema.  Lymphadenopathy:     Cervical: No cervical adenopathy.  Skin:    General: Skin is warm and dry.  Neurological:     Mental Status: She is alert and oriented to person, place, and time.     Comments: Sensation grossly intact to light touch in the extremities.  Grip strengths equal bilaterally.  Strength 5/5 in all extremities. No gait disturbance. Coordination intact. Cranial nerves III-XII grossly intact. No  facial droop.   Psychiatric:        Mood and Affect: Mood and affect normal.        Speech: Speech normal.        Behavior: Behavior normal.      ED Treatments / Results  Labs (all labs ordered are listed, but only abnormal results are displayed) Labs Reviewed  COMPREHENSIVE METABOLIC PANEL - Abnormal; Notable for the following components:      Result Value   Potassium 2.9 (*)    Glucose, Bld 102 (*)    Creatinine, Ser 1.12 (*)    GFR calc non Af Amer 51 (*)    GFR calc Af Amer 59 (*)    All other components within normal limits  CBC WITH DIFFERENTIAL/PLATELET  BRAIN NATRIURETIC PEPTIDE  TROPONIN I  PROTIME-INR   BUN  Date Value Ref Range Status  08/12/2018 19 8 - 23 mg/dL Final  16/01/9603 17 8 - 23 mg/dL Final  54/12/8117 24 (H) 8 - 23 mg/dL Final  14/78/2956 32 (H) 8 - 23 mg/dL Final   Creatinine, Ser  Date Value Ref Range Status  08/12/2018 1.12 (H) 0.44 - 1.00 mg/dL Final  21/30/8657 8.46 (H) 0.44 - 1.00 mg/dL Final  96/29/5284 1.32 (H) 0.44 - 1.00 mg/dL Final  44/04/270 5.36 (H) 0.44 - 1.00 mg/dL Final     EKG EKG Interpretation  Date/Time:  Monday August 12 2018 11:25:39 EDT Ventricular Rate:  101 PR Interval:    QRS Duration: 108 QT Interval:  357 QTC Calculation: 463 R Axis:   11 Text Interpretation:  Sinus tachycardia Probable anteroseptal infarct, old  Nonspecific T abnormalities, lateral leads Since last EKG, non-specific TWC No acute ST elevations  Confirmed by Shaune Pollack [phone]) on 08/12/2018 12:14:57 PM   Radiology Dg Chest 2 View  Result Date: 08/12/2018 CLINICAL DATA:  Shortness of breath for 3 days. History of bilateral pulmonary embolism. EXAM: CHEST - 2 VIEW COMPARISON:  Radiographs and CT 07/05/2018. FINDINGS: The heart size and mediastinal contours are normal. The lungs are clear. There is no pleural effusion or pneumothorax. No acute osseous findings are identified. Telemetry leads overlie the chest. IMPRESSION: Stable chest.  No evidence of active cardiopulmonary process. Electronically Signed   By: Carey Bullocks M.D.   On: 08/12/2018 12:12   Ct Angio Chest Pe W And/or Wo Contrast  Result Date: 08/12/2018 CLINICAL DATA:  Shortness of breath.  History of pulmonary embolus. EXAM: CT ANGIOGRAPHY CHEST WITH CONTRAST TECHNIQUE: Multidetector CT imaging of the chest was performed using the standard protocol during bolus administration of intravenous contrast. Multiplanar CT image reconstructions and MIPs were obtained to evaluate the vascular anatomy. CONTRAST:  OMNIPAQUE IOHEXOL 350 MG/ML SOLN COMPARISON:  CT scan of July 05, 2018. FINDINGS: Cardiovascular: Satisfactory opacification of the pulmonary arteries to the segmental level. No evidence of pulmonary embolism. Normal heart size. No pericardial effusion. Mediastinum/Nodes: No enlarged mediastinal, hilar, or axillary lymph nodes. Thyroid gland, trachea, and esophagus demonstrate no significant findings. Lungs/Pleura: Lungs are clear. No pleural effusion or pneumothorax. Upper Abdomen: No acute abnormality. Musculoskeletal: No chest wall abnormality. No acute or significant osseous findings. Review of the MIP images confirms the above findings. IMPRESSION: No evidence of pulmonary embolus. No significant abnormality seen in the chest. Electronically Signed   By: Lupita Raider  M.D.   On: 08/12/2018 14:00    Procedures Procedures (including critical care time)  Medications Ordered in ED Medications  potassium chloride 10 mEq in 100  mL IVPB ( Intravenous Stopped 08/12/18 1433)  potassium chloride SA (K-DUR) CR tablet 40 mEq (40 mEq Oral Given 08/12/18 1330)  0.9 %  sodium chloride infusion ( Intravenous Stopped 08/12/18 1434)  iohexol (OMNIPAQUE) 350 MG/ML injection 100 mL (100 mLs Intravenous Contrast Given 08/12/18 1316)     Initial Impression / Assessment and Plan / ED Course  I have reviewed the triage vital signs and the nursing notes.  Pertinent labs & imaging results that were available during my care of the patient were reviewed by me and considered in my medical decision making (see chart for details).  Clinical Course as of Aug 13 1003  Mon Aug 12, 2018  1302 Discussed lab results and chest x-ray with patient.  Patient continues to appear relaxed and in no distress.  She denies any current shortness of breath or other complaints.   [SJ]    Clinical Course User Index [SJ] Kaisy Severino C, PA-C       Patient presents with intermittent shortness of breath for the last 3 days. Patient is nontoxic appearing, afebrile, not tachycardic on my exam, not tachypneic, not hypotensive, maintains excellent SPO2 on room air, and is in no apparent distress.  Hypokalemia noted and addressed, no neurologic deficits noted on exam. Lab work otherwise reassuring.  Chest x-ray without acute abnormality.  Troponin negative.   Repeat CT PE study performed due to concern for possible recurrence of PE despite Eliquis, but no PE or other acute abnormality was noted.  Patient ambulated prior to discharge without onset of shortness of breath, need for systems, or significant vital sign abnormalities.  Return precautions discussed.  She will follow-up with her PCP and cardiology.  Patient voices understanding of these instructions, accepts the plan, and is comfortable with  discharge.  Labs and radiological studies were personally reviewed by me. EKG reviewed by me and compared with previous EKGs, if available.  Findings and plan of care discussed with Shaune Pollack, MD.   Vitals:   08/12/18 1124 08/12/18 1130 08/12/18 1330 08/12/18 1400  BP:  (!) 142/84 126/75 122/72  Pulse:  (!) 103 86 83  Resp:  Temp:      TempSrc:      SpO2:  99% 100% 100%  Weight: 98.4 kg     Height:  (1.6 m)        Final Clinical Impressions(s) / ED Diagnoses   Final diagnoses:  SOB (shortness of breath)  Hypokalemia    ED Discharge Orders    None       Concepcion Living 08/13/18 1038    Shaune Pollack, MD 08/16/18 1510

## 2018-08-12 NOTE — Discharge Instructions (Signed)
There is evidence of low potassium.  Please resume your potassium supplementation.   Findings were otherwise reassuring. Follow-up with your primary doctor on this matter as well as the shortness of breath.

## 2019-06-16 IMAGING — CT CT ANGIOGRAPHY CHEST
3 of 9 series · 18 of 36 positions shown · IV contrast (agent unspecified)
Comparison: None.

CLINICAL DATA: Shortness of breath for 1 week

EXAM:
CT ANGIOGRAPHY CHEST WITH CONTRAST
TECHNIQUE: Multidetector CT imaging of the chest was performed using the
standard protocol during bolus administration of intravenous
contrast. Multiplanar CT image reconstructions and MIPs were
obtained to evaluate the vascular anatomy.
CONTRAST:  65mL [id number]

[Series 6: pe thins · axial · 0.75mm/px · z∈[-261,-25]mm · 14 of 274 slices shown]
[im 19/274  lung]
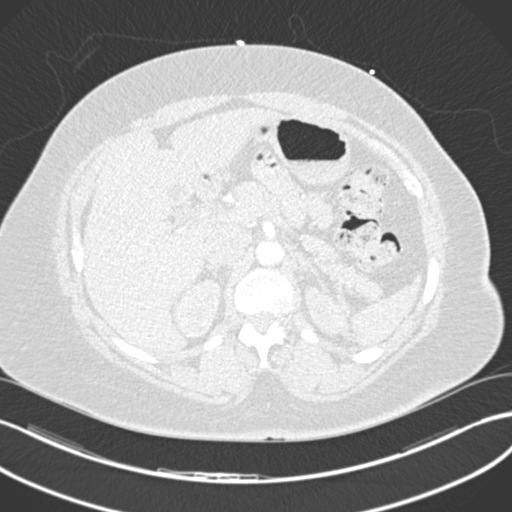
[im 37/274  mediastinal]
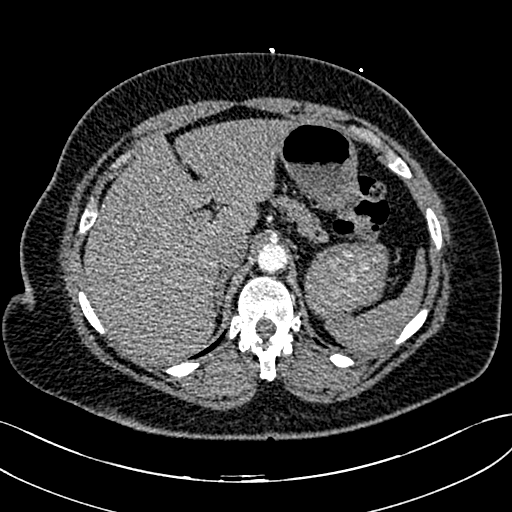
[im 55/274  lung]
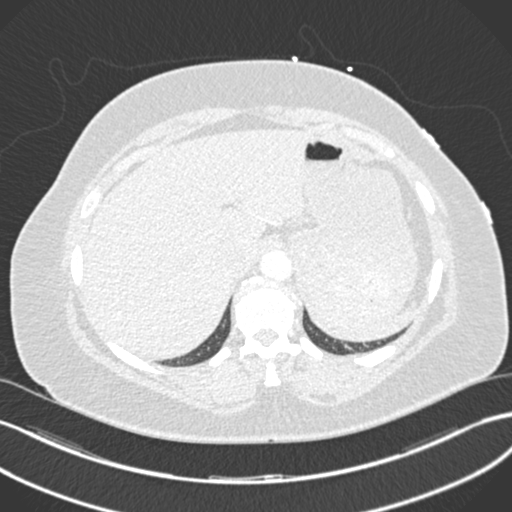
[im 73/274  mediastinal]
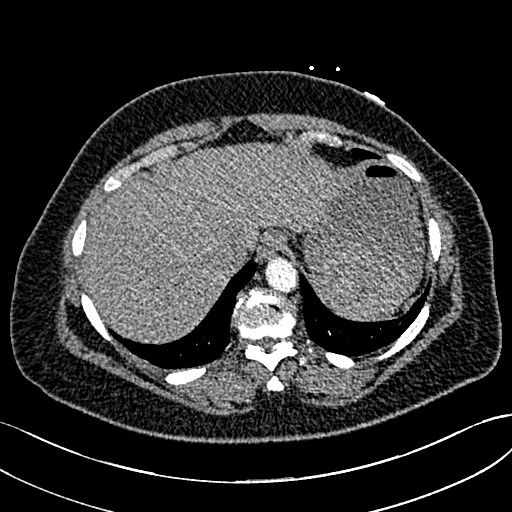
[im 92/274  lung]
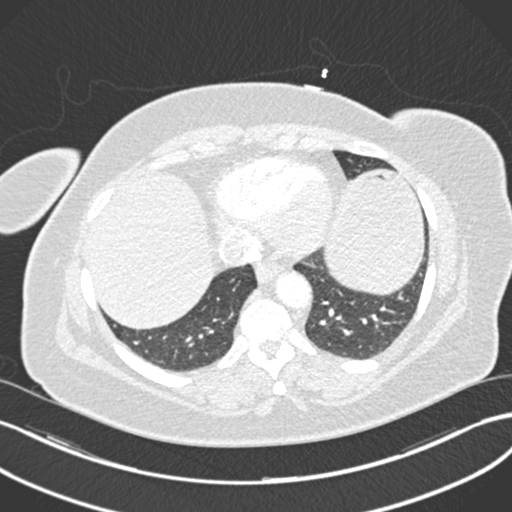
[im 110/274  mediastinal]
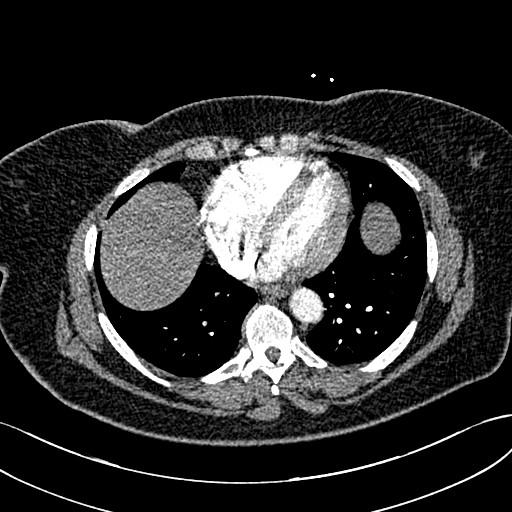
[im 128/274  lung]
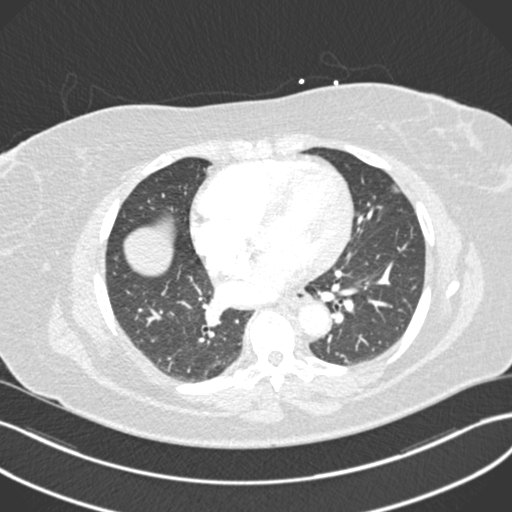
[im 146/274  mediastinal]
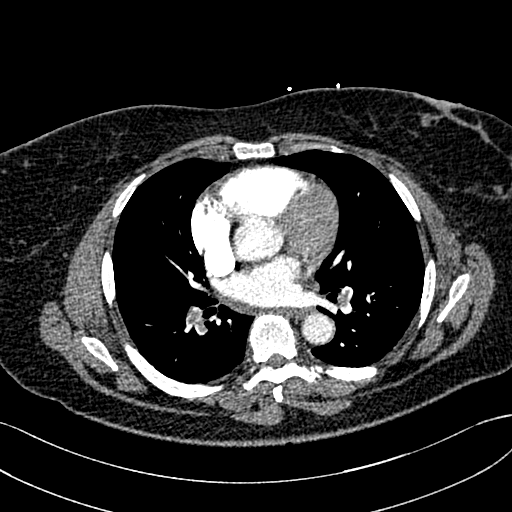
[im 164/274  lung]
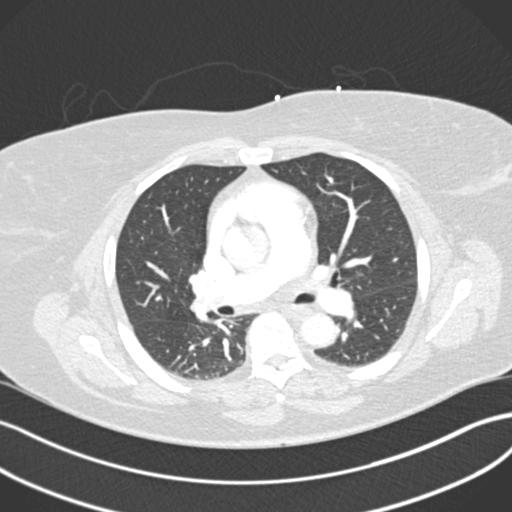
[im 183/274  mediastinal]
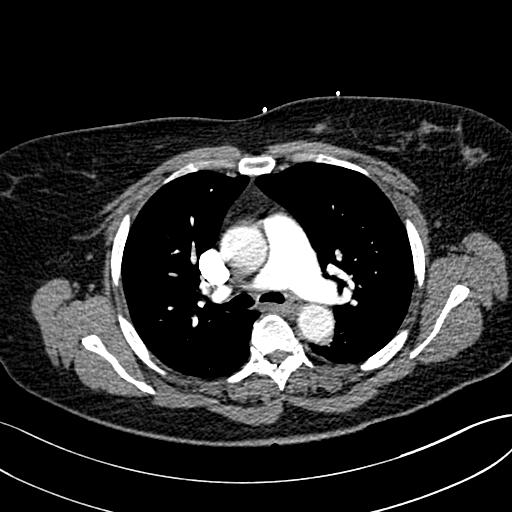
[im 201/274  lung]
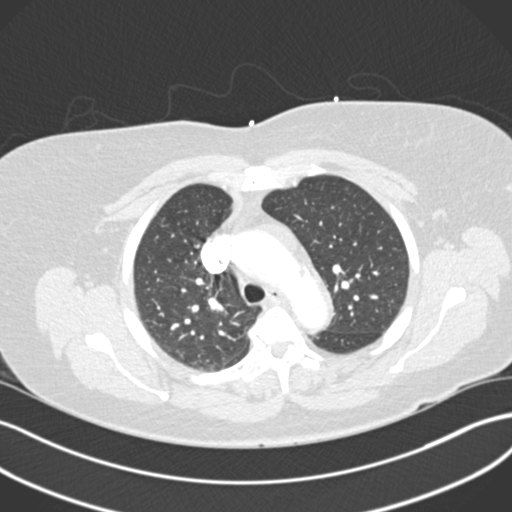
[im 219/274  mediastinal]
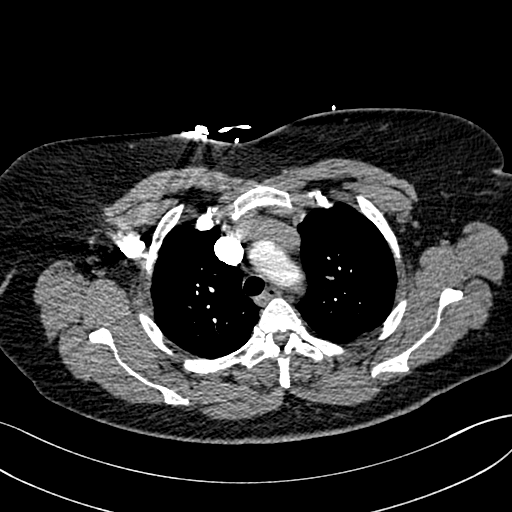
[im 237/274  lung]
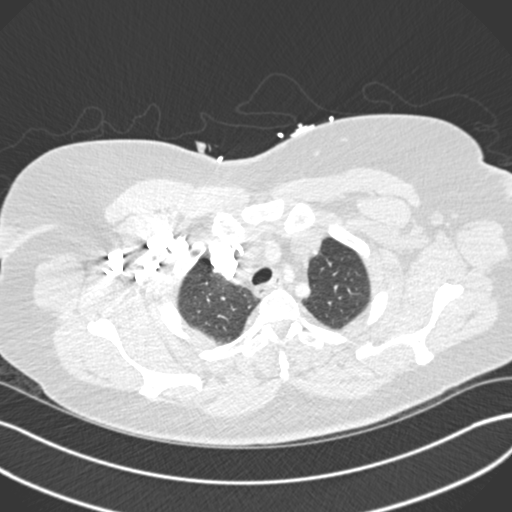
[im 255/274  mediastinal]
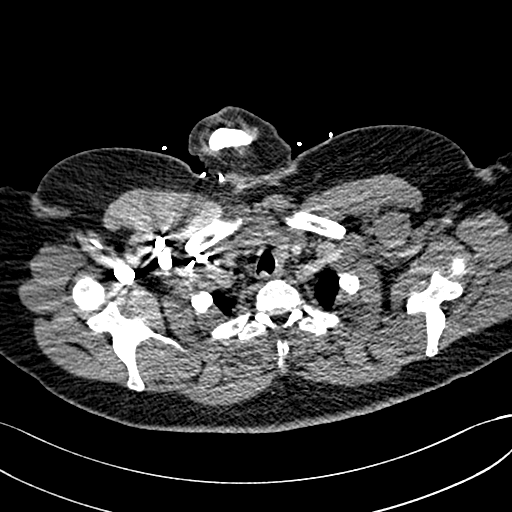

[Series 7: pe lung · axial · 0.94mm/px · z∈[-180,-66]mm · 3 of 78 slices shown]
[im 20/78  mediastinal]
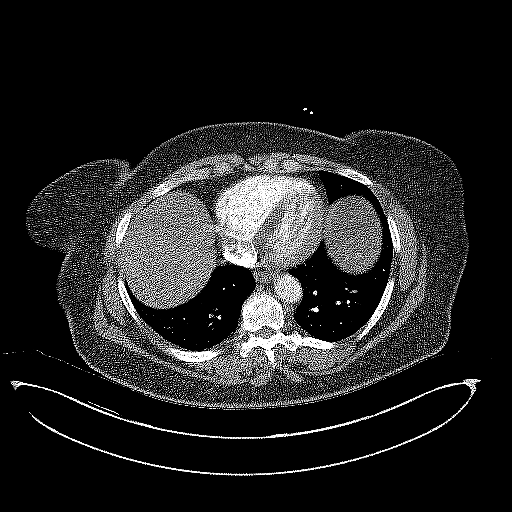
[im 39/78  mediastinal]
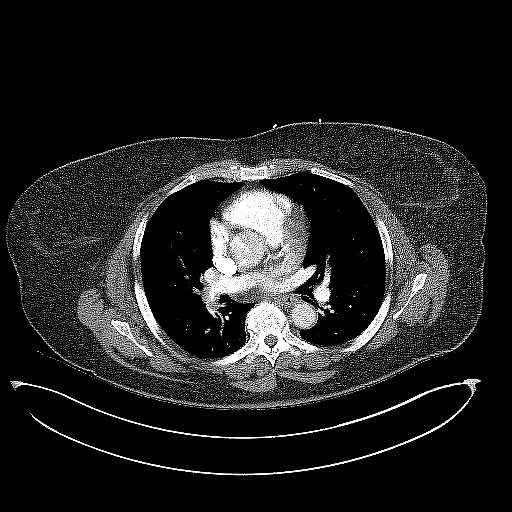
[im 58/78  mediastinal]
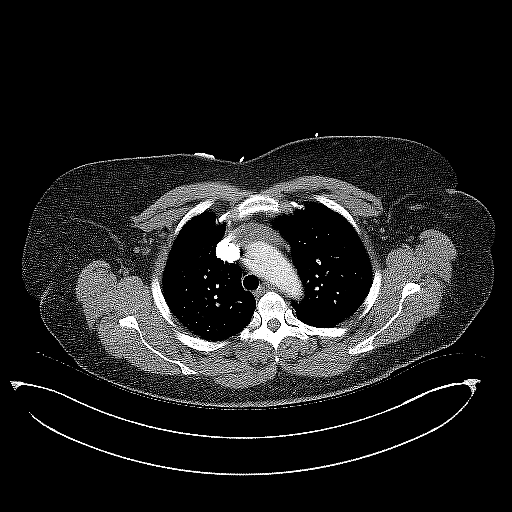

[Series 8: pe coronal mpr · coronal · 0.56mm/px · 1 of 136 slices shown]
[im 68/136  mediastinal]
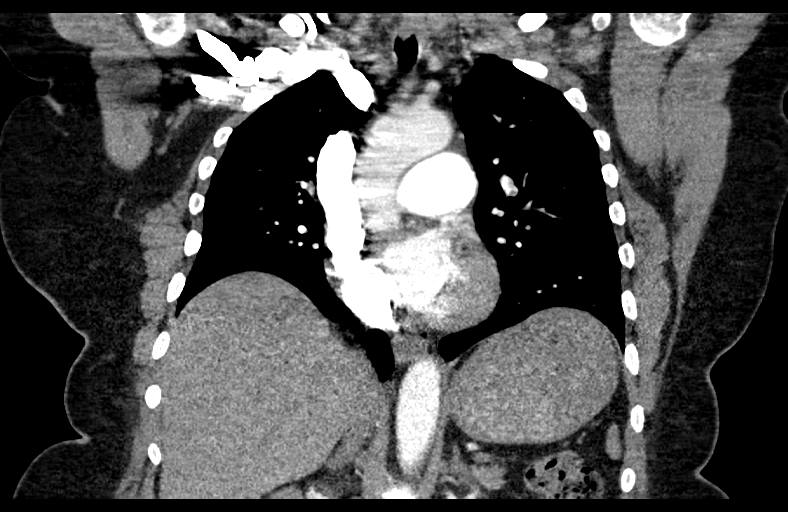

[18 of 36 positions shown; findings below may reference images not displayed]

FINDINGS: Cardiovascular: Thoracic aorta demonstrates mild atherosclerotic
calcifications. No aneurysmal dilatation is seen. No significant
cardiac enlargement is noted. Pulmonary artery demonstrates a normal
branching pattern with diffuse intraluminal filling defects
consistent with pulmonary emboli right greater than left. There are
changes consistent with right heart strain with a calculated RV/LV
ratio of 1.18.

Mediastinum/Nodes: Thoracic inlet is within normal limits. No hilar
or mediastinal adenopathy is noted. The esophagus as visualized is
within normal limits.

Lungs/Pleura: Well aerated without focal infiltrate or sizable
effusion. No pneumothorax is noted.

Upper Abdomen: Visualized upper abdomen is within normal limits.

Musculoskeletal: Mild degenerative changes of the thoracic spine are
noted. No acute bony abnormality is seen.

Review of the MIP images confirms the above findings.
IMPRESSION: Bilateral pulmonary emboli with evidence of right heart strain.

Critical Value/emergent results were called by telephone at the time
of interpretation on 07/05/2018 at [DATE] to Dr. KLPIGBB MOOLMAN , who
verbally acknowledged these results.

## 2019-07-05 ENCOUNTER — Other Ambulatory Visit: Payer: Self-pay

## 2019-07-05 ENCOUNTER — Encounter (HOSPITAL_BASED_OUTPATIENT_CLINIC_OR_DEPARTMENT_OTHER): Payer: Self-pay | Admitting: Emergency Medicine

## 2019-07-05 ENCOUNTER — Emergency Department (HOSPITAL_BASED_OUTPATIENT_CLINIC_OR_DEPARTMENT_OTHER)
Admission: EM | Admit: 2019-07-05 | Discharge: 2019-07-05 | Disposition: A | Discharge status: Home / Self Care | Payer: Medicare HMO | Attending: Emergency Medicine | Admitting: Emergency Medicine

## 2019-07-05 DIAGNOSIS — I10 Essential (primary) hypertension: Secondary | ICD-10-CM | POA: Diagnosis not present

## 2019-07-05 DIAGNOSIS — M545 Low back pain: Secondary | ICD-10-CM | POA: Diagnosis present

## 2019-07-05 DIAGNOSIS — M5432 Sciatica, left side: Secondary | ICD-10-CM | POA: Diagnosis not present

## 2019-07-05 DIAGNOSIS — Z79899 Other long term (current) drug therapy: Secondary | ICD-10-CM | POA: Diagnosis not present

## 2019-07-05 MED ORDER — TRAMADOL HCL 50 MG PO TABS: 50.0000 mg | ORAL_TABLET | Freq: Two times a day (BID) | ORAL | 0 refills | Status: AC
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
## 2019-07-05 NOTE — Discharge Instructions (Signed)
Please follow-up with primary doctor, ideally to be seen on Monday or Tuesday of this week.  Recommend continuing your previously prescribed tramadol.  Recommend Tylenol or Motrin as needed.  Note tramadol can make you drowsy and should not be taken while driving or operating heavy machinery.  Return to ER if you develop numbness, weakness, bladder or bowel incontinence, fever or other new concerning symptom.
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
## 2019-07-24 IMAGING — CR CHEST - 2 VIEW
2 series · 2 of 2 positions shown · non-contrast
Comparison: Radiographs and CT 07/05/2018.

CLINICAL DATA: Shortness of breath for 3 days. History of bilateral
pulmonary embolism.

EXAM:
CHEST - 2 VIEW

[w chest pa]
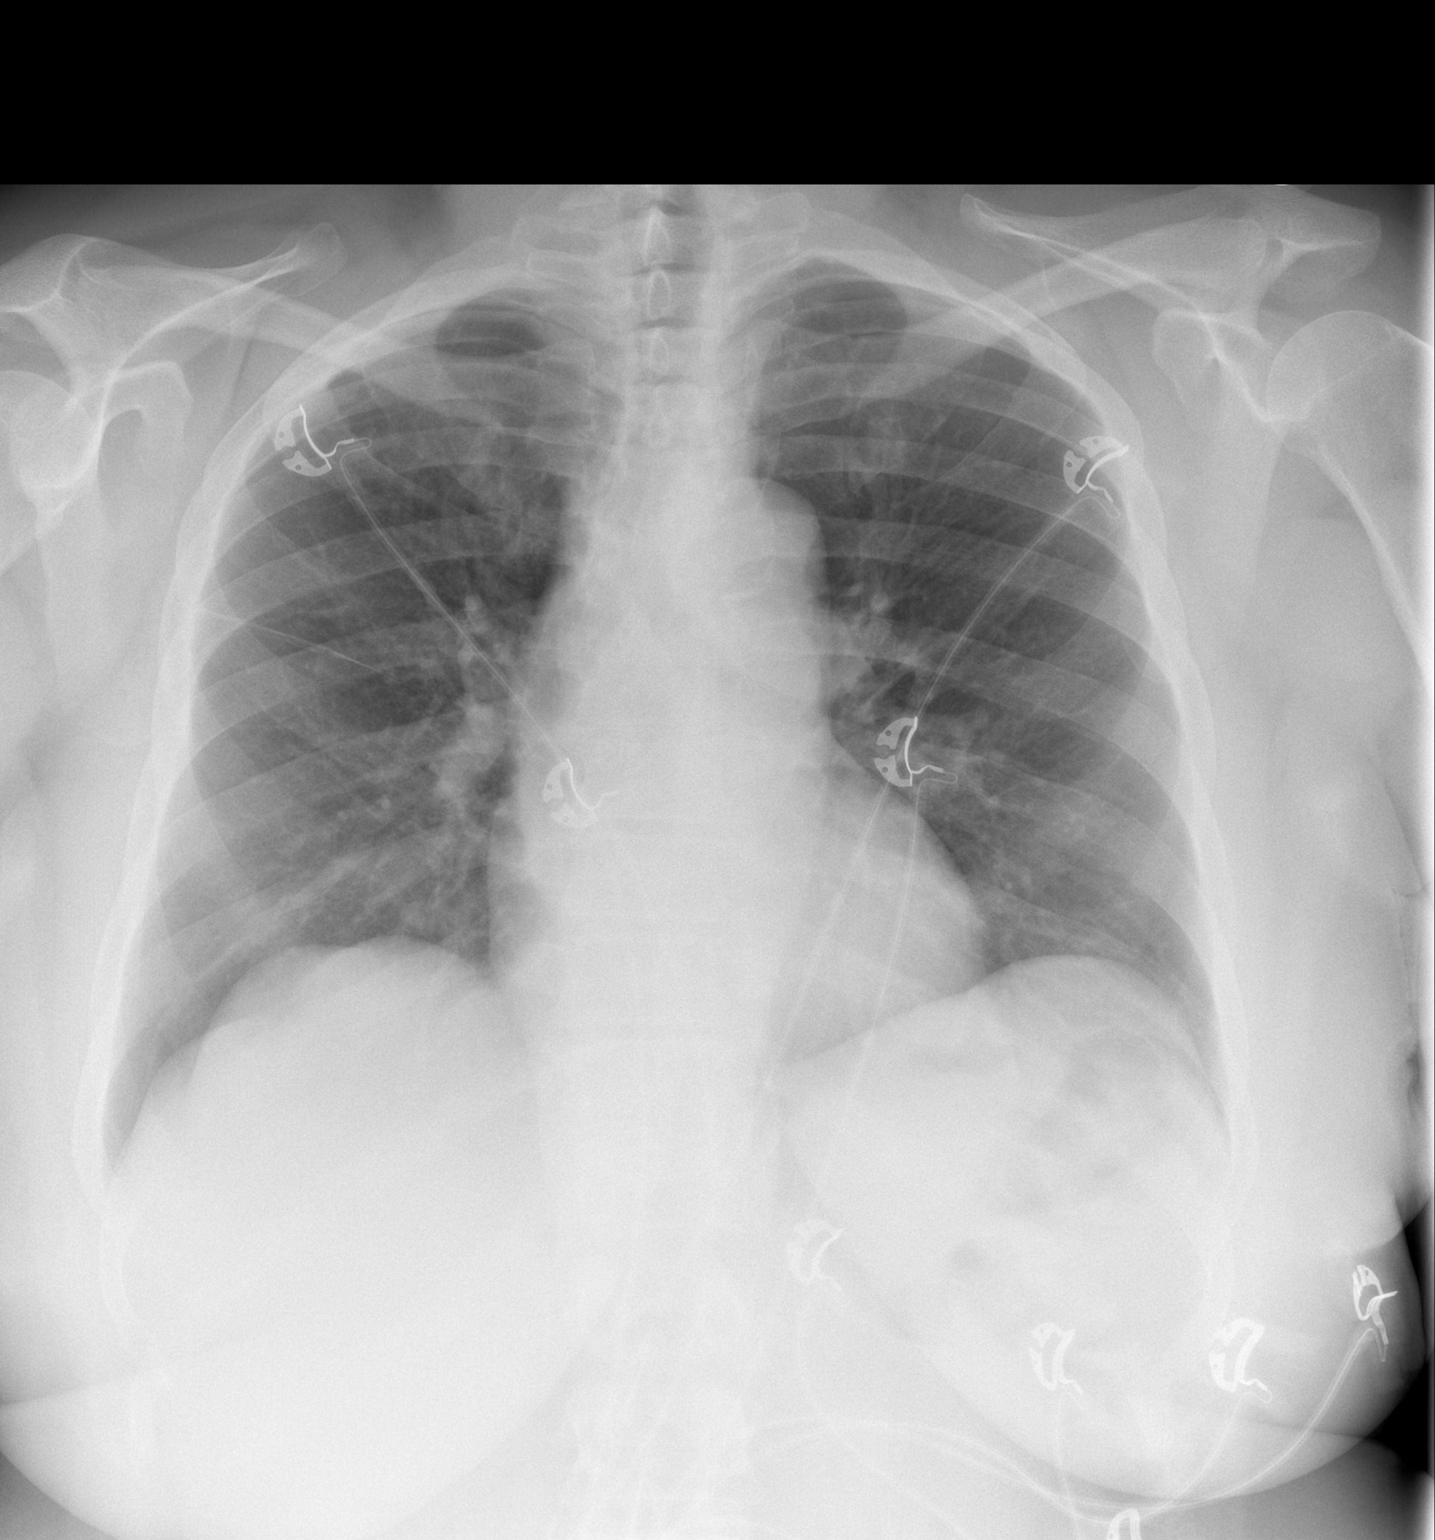

[w chest lat]
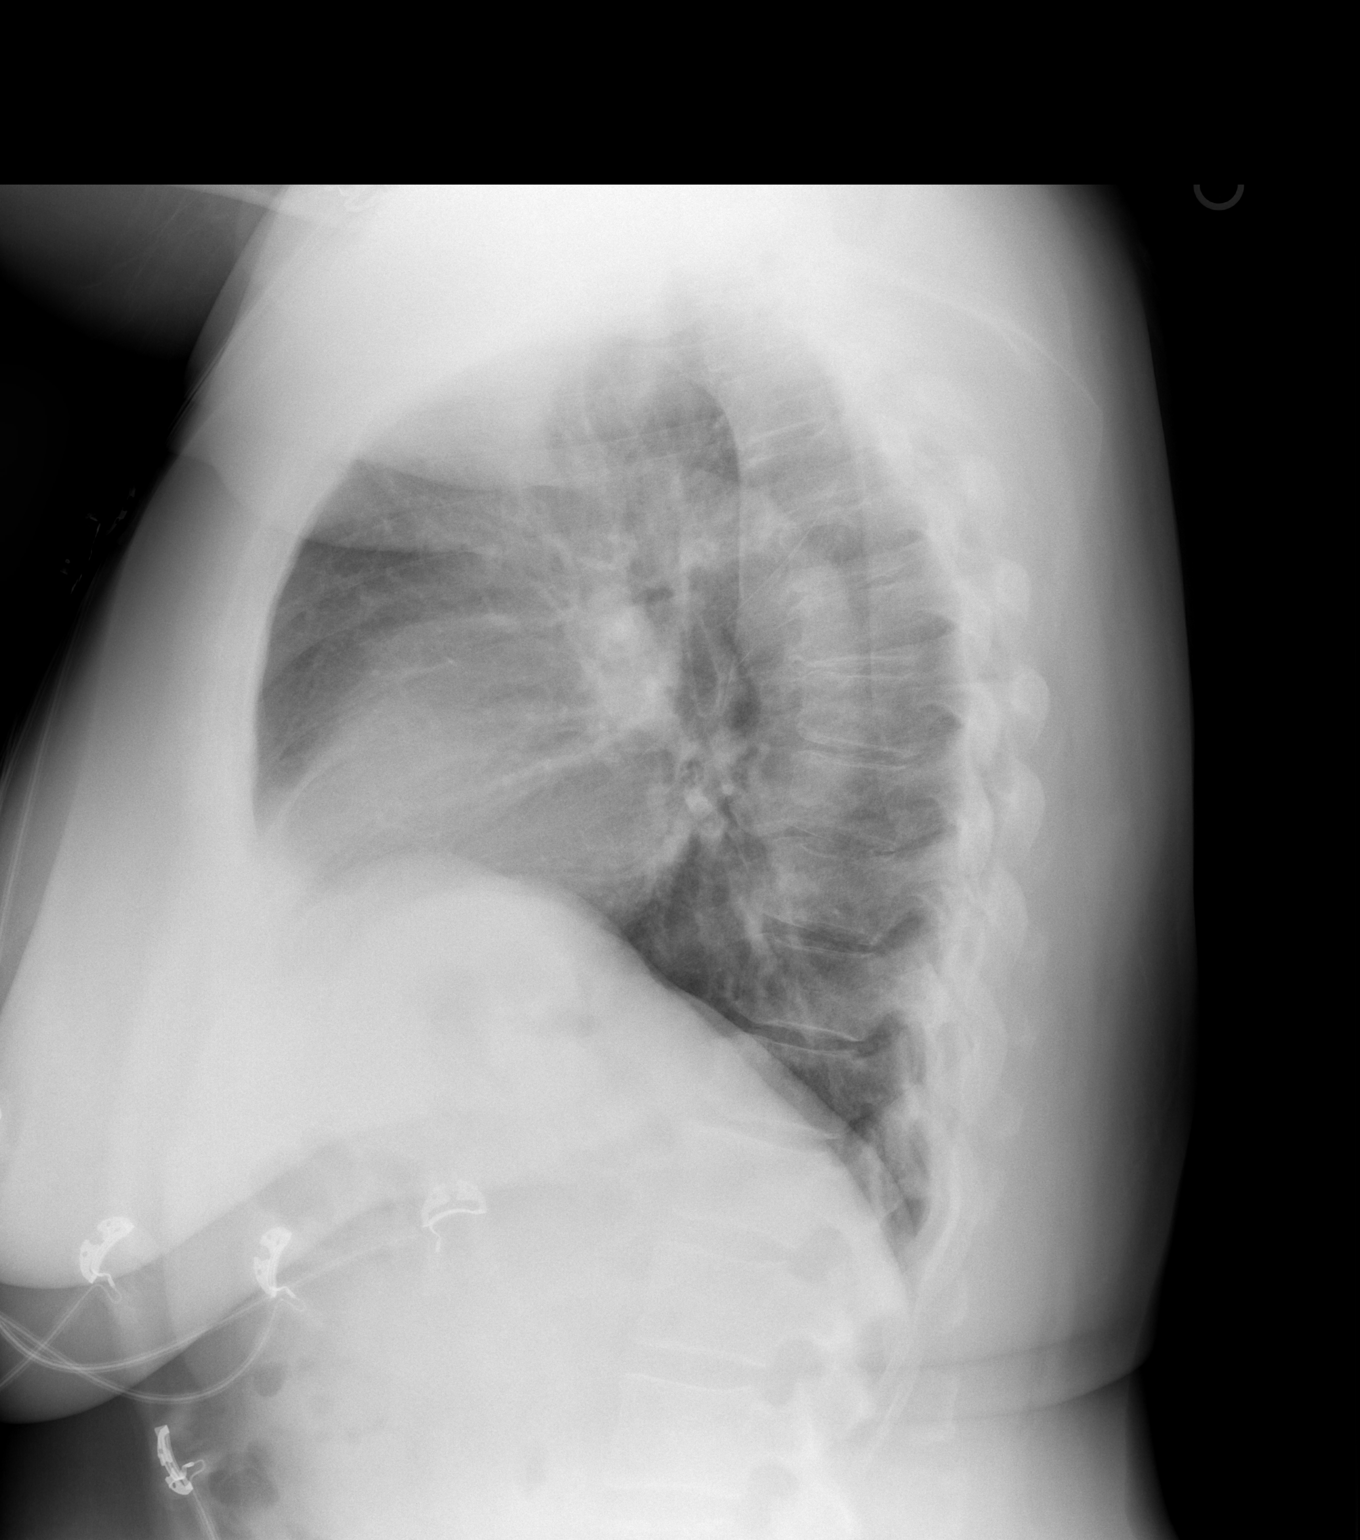

[2 of 2 positions shown; findings below may reference images not displayed]

FINDINGS: The heart size and mediastinal contours are normal. The lungs are
clear. There is no pleural effusion or pneumothorax. No acute
osseous findings are identified. Telemetry leads overlie the chest.
IMPRESSION: Stable chest.  No evidence of active cardiopulmonary process.

## 2021-10-13 ENCOUNTER — Encounter (HOSPITAL_COMMUNITY): Payer: Self-pay

## 2021-10-13 ENCOUNTER — Ambulatory Visit (HOSPITAL_COMMUNITY)
Admission: RE | Admit: 2021-10-13 | Discharge: 2021-10-13 | Disposition: A | Discharge status: Home / Self Care | Payer: Medicare HMO | Source: Ambulatory Visit | Attending: Internal Medicine | Admitting: Internal Medicine

## 2021-10-13 VITALS — BP 152/84 | HR 93 | Temp 98.3°F | Resp 16

## 2021-10-13 DIAGNOSIS — M10071 Idiopathic gout, right ankle and foot: Secondary | ICD-10-CM | POA: Diagnosis not present

## 2021-10-13 HISTORY — DX: Coagulation defect, unspecified: D68.9

## 2021-10-13 MED ORDER — COLCHICINE 0.6 MG PO TABS: ORAL_TABLET | ORAL | 1 refills | Status: AC

## 2021-10-13 MED ORDER — TRAMADOL HCL 50 MG PO TABS: 50.0000 mg | ORAL_TABLET | Freq: Four times a day (QID) | ORAL | 0 refills | Status: AC | PRN

## 2021-10-13 NOTE — ED Triage Notes (Signed)
Patient c/o RT foot pain that started yesterday.   Patient denies trauma to foot.   Patient has swelling present to foot upon assessment.   Patient endorses pain has progressively worsened.   Patient endorses pain upon waking yesterday.   Patient endorses pain upon ambulation.   History of Gout.   Patient has taking Tylenol and Ibuprofen with no relief of symptoms.

## 2021-10-13 NOTE — Discharge Instructions (Addendum)
Please take medications as prescribed You could take Tylenol as needed for pain. Return to urgent care if you have worsening symptoms.

## 2021-10-15 NOTE — ED Provider Notes (Signed)
[place]    CSN: [id number] Arrival date & time: 10/13/21  0847      History   Chief Complaint Chief Complaint  Patient presents with   Foot Pain   APPT 0930    HPI Denise Haynes is a 71 y.o. female comes to the urgent care with 2 days right foot pain.  Patient has a history of gout.  Pain is throbbing 16 aggravated by palpation or movement and denies any relieving factors.  Patient has used colchicine and indomethacin in the past for acute gout flares.  No fever or chills.  No falls or trauma to the foot.    HPI  Past Medical History:  Diagnosis Date   Clotting disorder [facility])    Hypertension     Patient Active Problem List   Diagnosis Date Noted   HTN (hypertension) 07/06/2018   HLD (hyperlipidemia) 07/06/2018   AKI (acute kidney injury) ([place]) 07/06/2018   Bilateral pulmonary embolism ([place]) 07/05/2018    Past Surgical History:  Procedure Laterality Date   CESAREAN SECTION      OB History   No obstetric history on file.      Home Medications    Prior to Admission medications   Medication Sig Start Date End Date Taking? Authorizing Provider  amLODipine (NORVASC) 10 MG tablet Take 10 mg by mouth daily.   Yes [provider]  colchicine 0.6 MG tablet Please take 2 tablets now and then take 1 tablet 1 hour after taking the first tablet 10/13/21  Yes Sable Knoles, Myrene Galas, MD  Eliquis DVT/PE Starter Pack (ELIQUIS STARTER PACK) 5 MG TABS Take as directed on package: start with two-5mg  tablets twice daily for 7 days. On day 8, switch to one-5mg  tablet twice daily. 07/08/18  Yes Cherene Altes, MD  lisinopril (PRINIVIL,ZESTRIL) 20 MG tablet Take 10 mg by mouth daily.   Yes [provider]  traMADol (ULTRAM) 50 MG tablet Take 1 tablet (50 mg total) by mouth every 6 (six) hours as needed for up to 5 days. 10/13/21 10/18/21 Yes Deborah Lazcano, Myrene Galas, MD  acetaminophen (TYLENOL) 325 MG tablet Take 2 tablets (650 mg total) by mouth every 6 (six)  hours as needed for mild pain (or Fever >/= 101). 07/08/18   Cherene Altes, MD  allopurinol (ZYLOPRIM) 300 MG tablet Take 300 mg by mouth daily.    [provider]  amphetamine-dextroamphetamine (ADDERALL) 30 MG tablet Take 30 mg by mouth daily as needed (to focus at work).  06/26/18   [provider]  Cholecalciferol (VITAMIN D3) 50 MCG (2000 UT) TABS Take 2,000 Units by mouth daily.    [provider]  Cyanocobalamin (VITAMIN B-12 PO) Take 1 tablet by mouth daily.    [provider]  metolazone (ZAROXOLYN) 5 MG tablet Take 5 mg by mouth daily.    [provider]  Potassium Chloride ER 20 MEQ TBCR Take 10 mEq by mouth daily.     [provider]  vitamin C (ASCORBIC ACID) 500 MG tablet Take 500 mg by mouth daily.    [provider]    Family History Family History  Problem Relation Age of Onset   Diabetes Mellitus II Mother    Hypertension Mother    Heart disease Father    Diabetes Mellitus II Sister     Social History Social History   Tobacco Use   Smoking status: Never   Smokeless tobacco: Never  Vaping Use   Vaping Use: Never  used  Substance Use Topics   Alcohol use: Not Currently   Drug use: Never     Allergies   Sulfa antibiotics   Review of Systems Review of Systems  Constitutional: Negative.   Gastrointestinal: Negative.   Genitourinary: Negative.   Musculoskeletal:  Positive for arthralgias and joint swelling. Negative for myalgias.  Skin: Negative.      Physical Exam Triage Vital Signs ED Triage Vitals [10/13/21 0920]  Enc Vitals Group     BP (!) 152/84     Pulse Rate 93     Resp 16     Temp 98.3 F (36.8 C)     Temp Source Oral     SpO2 100 %     Weight      Height      Head Circumference      Peak Flow      Pain Score 10     Pain Loc      Pain Edu?      Excl. in GC?    No data found.  Updated Vital Signs BP (!) 152/84 (BP Location: Right Arm)   Pulse 93   Temp 98.3 F  (36.8 C) (Oral)   Resp 16   SpO2 100%   Visual Acuity Right Eye Distance:   Left Eye Distance:   Bilateral Distance:    Right Eye Near:   Left Eye Near:    Bilateral Near:     Physical Exam Vitals and nursing note reviewed.  Constitutional:      General: She is in acute distress.     Appearance: She is not ill-appearing.  Cardiovascular:     Rate and Rhythm: Normal rate and regular rhythm.  Musculoskeletal:        General: Swelling and tenderness present.     Comments: ROM of the first MTP of the right foot is limited secondary to pain.  Neurological:     Mental Status: She is alert.      UC Treatments / Results  Labs (all labs ordered are listed, but only abnormal results are displayed) Labs Reviewed - No data to display  EKG   Radiology No results found.  Procedures Procedures (including critical care time)  Medications Ordered in UC Medications - No data to display  Initial Impression / Assessment and Plan / UC Course  I have reviewed the triage vital signs and the nursing notes.  Pertinent labs & imaging results that were available during my care of the patient were reviewed by me and considered in my medical decision making (see chart for details).     1.  Acute gout flare Colchicine 1.2 mg x 1 dose followed by 0.6 mg an hour after the first dose Tramadol as needed for pain Patient filled her tramadol in May 2023 (she could not 90-day supply).  She moved here from [place] apparently approximately 2-week supply of her medications.  She has been in [place] for 3 weeks so she is ran out of pain medications on hand.  I prescribed her some tramadol for the acute pain. Return precautions given. Final Clinical Impressions(s) / UC Diagnoses   Final diagnoses:  Acute idiopathic gout of right foot     Discharge Instructions      Please take medications as prescribed You could take Tylenol as needed for pain. Return to urgent care if you have  worsening symptoms.   ED Prescriptions     Medication Sig Dispense Auth. Provider   colchicine 0.6  MG tablet Please take 2 tablets now and then take 1 tablet 1 hour after taking the first tablet 3 tablet Keyani Rigdon, Britta Mccreedy, MD   traMADol (ULTRAM) 50 MG tablet Take 1 tablet (50 mg total) by mouth every 6 (six) hours as needed for up to 5 days. 15 tablet Jesten Cappuccio, Britta Mccreedy, MD      I have reviewed the PDMP during this encounter.   Merrilee Jansky, MD 10/15/21 [phone]

## 2021-10-29 ENCOUNTER — Telehealth (HOSPITAL_COMMUNITY): Payer: Self-pay | Admitting: Emergency Medicine
